# Patient Record
Sex: Female | Born: 1980
Health system: Southern US, Community
[De-identification: ages and names within clinical notes are randomized; demographics above are authoritative.]

---

## 2001-04-11 ENCOUNTER — Other Ambulatory Visit: Admission: RE | Admit: 2001-04-11 | Discharge: 2001-04-11 | Payer: Self-pay | Admitting: Obstetrics and Gynecology

## 2001-04-17 ENCOUNTER — Encounter: Payer: Self-pay | Admitting: Obstetrics and Gynecology

## 2001-04-17 ENCOUNTER — Ambulatory Visit (HOSPITAL_COMMUNITY): Admission: RE | Admit: 2001-04-17 | Discharge: 2001-04-17 | Payer: Self-pay | Admitting: Obstetrics and Gynecology

## 2002-04-11 ENCOUNTER — Inpatient Hospital Stay (HOSPITAL_COMMUNITY): Admission: RE | Admit: 2002-04-11 | Discharge: 2002-04-14 | Payer: Self-pay | Admitting: Obstetrics and Gynecology

## 2002-04-11 ENCOUNTER — Encounter: Payer: Self-pay | Admitting: Obstetrics and Gynecology

## 2003-04-04 ENCOUNTER — Inpatient Hospital Stay (HOSPITAL_COMMUNITY): Admission: RE | Admit: 2003-04-04 | Discharge: 2003-04-06 | Payer: Self-pay | Admitting: Obstetrics and Gynecology

## 2015-12-25 ENCOUNTER — Encounter: Payer: Self-pay | Admitting: Family Medicine

## 2015-12-25 ENCOUNTER — Ambulatory Visit (INDEPENDENT_AMBULATORY_CARE_PROVIDER_SITE_OTHER): Payer: 59 | Admitting: Family Medicine

## 2015-12-25 VITALS — BP 128/82 | Temp 98.4°F | Wt 246.8 lb

## 2015-12-25 DIAGNOSIS — J329 Chronic sinusitis, unspecified: Secondary | ICD-10-CM | POA: Diagnosis not present

## 2015-12-25 DIAGNOSIS — J209 Acute bronchitis, unspecified: Secondary | ICD-10-CM

## 2015-12-25 DIAGNOSIS — J310 Chronic rhinitis: Secondary | ICD-10-CM

## 2015-12-25 MED ORDER — AMOXICILLIN-POT CLAVULANATE 875-125 MG PO TABS: 1.0000 | ORAL_TABLET | Freq: Two times a day (BID) | ORAL | Status: AC

## 2015-12-25 NOTE — Progress Notes (Signed)
   Subjective:    Patient ID: Norm ParcelMattie M Delaguila, female    DOB: [birth year], 35 y.o.   MRN: [id number]  Cough This is a new problem. The current episode started in the past 7 days. Associated symptoms include a fever and nasal congestion. Treatments tried: mucinex, nyquil.    ches t cong and productive  Feels really bad an d rattly  Low gr temp 99.8  Took ibuprofen  Fever was initial  Stared withsinus  Dim energy   Clear disch at times, gunky and thick and yellow t times     Review of Systems  Constitutional: Positive for fever.  Respiratory: Positive for cough.        Objective:   Physical Exam  Alert, mild malaise. Hydration good Vitals stable. frontal/ maxillary tenderness evident positive nasal congestion. pharynx normal neck supple  lungs clear/no crackles or wheezes. heart regular in rhythm       Assessment & Plan:  Impression rhinosinusitis /bronchitislikely post viral, discussed with patient. plan antibiotics prescribed. Questions answered. Symptomatic care discussed. warning signs discussed. WSL

## 2016-01-15 ENCOUNTER — Ambulatory Visit (INDEPENDENT_AMBULATORY_CARE_PROVIDER_SITE_OTHER): Payer: 59 | Admitting: Nurse Practitioner

## 2016-01-15 ENCOUNTER — Encounter: Payer: Self-pay | Admitting: Nurse Practitioner

## 2016-01-15 VITALS — BP 128/80 | Ht 65.0 in | Wt 247.2 lb

## 2016-01-15 DIAGNOSIS — Z1151 Encounter for screening for human papillomavirus (HPV): Secondary | ICD-10-CM

## 2016-01-15 DIAGNOSIS — Z124 Encounter for screening for malignant neoplasm of cervix: Secondary | ICD-10-CM

## 2016-01-15 DIAGNOSIS — Z0000 Encounter for general adult medical examination without abnormal findings: Secondary | ICD-10-CM

## 2016-01-15 DIAGNOSIS — L680 Hirsutism: Secondary | ICD-10-CM

## 2016-01-15 DIAGNOSIS — Z01419 Encounter for gynecological examination (general) (routine) without abnormal findings: Secondary | ICD-10-CM

## 2016-01-15 MED ORDER — PHENTERMINE HCL 37.5 MG PO TABS: 37.5000 mg | ORAL_TABLET | Freq: Every day | ORAL | Status: DC

## 2016-01-15 NOTE — Progress Notes (Signed)
Subjective:    Patient ID: Raven Burch, female    DOB: [birth year], 35 y.o.   MRN: [id number]  HPI presents for her wellness physical. This is the first one she has had in a long time due to lack of insurance. Married, same sexual partner. Regular cycles, normal flow lasting up to 5 days. Has 2 children both weighing over 9 lbs at birth. It was noted that patient shaves chest, abd and chin due to significant hair growth. No acne. Weighed about 200 lbs around the birth of her first child. Hair started growing during her second pregnancy but has gotten worse. Her mother had 2 miscarriages, excessive hair growth and very obese. Regular vision exams. Plans dental care now that she has insurance. Very active lifestyle.     Review of Systems  Constitutional: Positive for fatigue. Negative for activity change and appetite change.  HENT: Negative for dental problem, ear pain, sinus pressure and sore throat.   Respiratory: Negative for cough, chest tightness, shortness of breath and wheezing.   Cardiovascular: Negative for chest pain.  Gastrointestinal: Negative for nausea, vomiting, abdominal pain, diarrhea, constipation and abdominal distention.  Genitourinary: Negative for dysuria, urgency, frequency, vaginal discharge, enuresis, difficulty urinating, genital sores, menstrual problem and pelvic pain.       Objective:   Physical Exam  Constitutional: She is oriented to person, place, and time. She appears well-developed. No distress.  HENT:  Right Ear: External ear normal.  Left Ear: External ear normal.  Mouth/Throat: Oropharynx is clear and moist.  Neck: Normal range of motion. Neck supple. No tracheal deviation present. No thyromegaly present.  Cardiovascular: Normal rate, regular rhythm and normal heart sounds.  Exam reveals no gallop.   No murmur heard. Pulmonary/Chest: Effort normal and breath sounds normal.  Abdominal: Soft. She exhibits no distension. There is no tenderness.    Genitourinary: Vagina normal and uterus normal. No vaginal discharge found.  External GU: no rashes or lesions. Vagina: minimal mucoid discharge. Cervix normal in appearance; no CMT. Bimanual exam: no tenderness or obvious masses. Exam limited due to abd girth and anxiety level regarding pelvic exam.   Musculoskeletal: She exhibits no edema.  Lymphadenopathy:    She has no cervical adenopathy.  Neurological: She is alert and oriented to person, place, and time.  Skin: Skin is warm and dry. No rash noted.  Psychiatric: She has a normal mood and affect. Her behavior is normal.  Vitals reviewed. Breast exam: Very dense tissue, no masses. Axilla no adenopathy. Patient has shaved her chest abdomen and chin, slight hair growth noted over most of the chest and abdomen.        Assessment & Plan:   Problem List Items Addressed This Visit      Musculoskeletal and Integument   Hirsutism   Relevant Orders   Insulin, fasting   Testosterone     Other   Morbid obesity (HCC)   Relevant Medications   phentermine (ADIPEX-P) 37.5 MG tablet   Other Relevant Orders   Lipid panel   Basic metabolic panel   TSH    Other Visit Diagnoses    Well woman exam    -  Primary    Relevant Orders    Pap IG and HPV (high risk) DNA detection    Lipid panel    Hepatic function panel    Basic metabolic panel    TSH    VITAMIN D 25 Hydroxy (Vit-D Deficiency, Fractures)    Screening for cervical cancer  Relevant Orders    Pap IG and HPV (high risk) DNA detection    Screening for HPV (human papillomavirus)        Relevant Orders    Pap IG and HPV (high risk) DNA detection      Discussed importance of healthy diet low in sugar and simple carbs and weight loss. Discussed possible diagnosis of PCOS. Reviewed options including hormones and Aldactone. Will start with phentermine for weight loss, reviewed potential adverse effects. DC med and call if any problems. Recommend tetanus exam at local  pharmacy. Return in about 1 month (around 02/14/2016) for recheck.

## 2016-01-16 ENCOUNTER — Other Ambulatory Visit: Payer: Self-pay | Admitting: Nurse Practitioner

## 2016-01-16 DIAGNOSIS — E282 Polycystic ovarian syndrome: Secondary | ICD-10-CM

## 2016-01-16 DIAGNOSIS — E559 Vitamin D deficiency, unspecified: Secondary | ICD-10-CM | POA: Insufficient documentation

## 2016-01-16 LAB — TSH: TSH: 2.77 u[IU]/mL (ref 0.450–4.500)

## 2016-01-16 LAB — VITAMIN D 25 HYDROXY (VIT D DEFICIENCY, FRACTURES): Vit D, 25-Hydroxy: 19 ng/mL — ABNORMAL LOW (ref 30.0–100.0)

## 2016-01-16 LAB — BASIC METABOLIC PANEL
BUN/Creatinine Ratio: 8 — ABNORMAL LOW (ref 9–23)
BUN: 7 mg/dL (ref 6–20)
CO2: 22 mmol/L (ref 18–29)
Calcium: 9.8 mg/dL (ref 8.7–10.2)
Chloride: 100 mmol/L (ref 96–106)
Creatinine, Ser: 0.88 mg/dL (ref 0.57–1.00)
GFR calc Af Amer: 98 mL/min/{1.73_m2} (ref >59)
GFR calc non Af Amer: 85 mL/min/{1.73_m2} (ref >59)
Glucose: 84 mg/dL (ref 65–99)
Potassium: 4.1 mmol/L (ref 3.5–5.2)
Sodium: 141 mmol/L (ref 134–144)

## 2016-01-16 LAB — INSULIN, RANDOM: INSULIN: 17 u[IU]/mL (ref 2.6–24.9)

## 2016-01-16 LAB — LIPID PANEL
Chol/HDL Ratio: 4.1 ratio units (ref 0.0–4.4)
Cholesterol, Total: 147 mg/dL (ref 100–199)
HDL: 36 mg/dL — ABNORMAL LOW (ref >39)
LDL Calculated: 85 mg/dL (ref 0–99)
Triglycerides: 130 mg/dL (ref 0–149)
VLDL Cholesterol Cal: 26 mg/dL (ref 5–40)

## 2016-01-16 LAB — HEPATIC FUNCTION PANEL
ALT: 33 IU/L — ABNORMAL HIGH (ref 0–32)
AST: 26 IU/L (ref 0–40)
Albumin: 4.6 g/dL (ref 3.5–5.5)
Alkaline Phosphatase: 100 IU/L (ref 39–117)
Bilirubin Total: 0.4 mg/dL (ref 0.0–1.2)
Bilirubin, Direct: 0.11 mg/dL (ref 0.00–0.40)
Total Protein: 7.3 g/dL (ref 6.0–8.5)

## 2016-01-16 LAB — TESTOSTERONE: Testosterone: 64 ng/dL — ABNORMAL HIGH (ref 8–48)

## 2016-01-16 MED ORDER — VITAMIN D (ERGOCALCIFEROL) 1.25 MG (50000 UNIT) PO CAPS: 50000.0000 [IU] | ORAL_CAPSULE | ORAL | Status: DC

## 2016-01-19 LAB — PAP IG AND HPV HIGH-RISK
HPV, high-risk: NEGATIVE
PAP Smear Comment: 0

## 2016-02-16 ENCOUNTER — Ambulatory Visit (INDEPENDENT_AMBULATORY_CARE_PROVIDER_SITE_OTHER): Payer: 59 | Admitting: Nurse Practitioner

## 2016-02-16 ENCOUNTER — Encounter: Payer: Self-pay | Admitting: Nurse Practitioner

## 2016-02-16 VITALS — BP 128/82 | Ht 65.0 in | Wt 236.6 lb

## 2016-02-16 DIAGNOSIS — E559 Vitamin D deficiency, unspecified: Secondary | ICD-10-CM | POA: Diagnosis not present

## 2016-02-16 MED ORDER — PHENTERMINE HCL 37.5 MG PO TABS: 37.5000 mg | ORAL_TABLET | Freq: Every day | ORAL | Status: DC

## 2016-02-16 MED ORDER — VITAMIN D (ERGOCALCIFEROL) 1.25 MG (50000 UNIT) PO CAPS: 50000.0000 [IU] | ORAL_CAPSULE | ORAL | Status: DC

## 2016-02-17 ENCOUNTER — Encounter: Payer: Self-pay | Admitting: Nurse Practitioner

## 2016-02-17 NOTE — Progress Notes (Signed)
Subjective:  Presents for routine follow-up. Doing well on phentermine. Denies any adverse effects. Has increased her activity. Doing much better with her diet. Also patient did not start her vitamin D prescription, states it was not at the pharmacy.  Objective:   BP 128/82 mmHg  Ht 5\' 5"  (1.651 m)  Wt 236 lb 9.6 oz (107.321 kg)  BMI 39.37 kg/m2 NAD. Alert, oriented. Lungs clear. Heart regular rate rhythm. Has lost approximately 11 pounds since her last visit.   Assessment:  Problem List Items Addressed This Visit      Other   Morbid obesity (HCC)   Relevant Medications   phentermine (ADIPEX-P) 37.5 MG tablet   Vitamin D deficiency - Primary     Plan:  Meds ordered this encounter  Medications  . Vitamin D, Ergocalciferol, (DRISDOL) 50000 units CAPS capsule    Sig: Take 1 capsule (50,000 Units total) by mouth every 7 (seven) days.    Dispense:  4 capsule    Refill:  2    Order Specific Question:  Supervising Provider    Answer:  Merlyn AlbertLUKING, WILLIAM S [[id number]]  . phentermine (ADIPEX-P) 37.5 MG tablet    Sig: Take 1 tablet (37.5 mg total) by mouth daily before breakfast.    Dispense:  30 tablet    Refill:  2    Order Specific Question:  Supervising Provider    Answer:  Merlyn AlbertLUKING, WILLIAM S [[id number]]   Continue phentermine as directed. DC med if weight begins to plateau. Also start vitamin D prescription as directed. Recheck in 3 months if she wishes to continue phentermine.

## 2016-05-17 ENCOUNTER — Ambulatory Visit: Payer: 59 | Admitting: Nurse Practitioner

## 2016-05-27 ENCOUNTER — Encounter: Payer: Self-pay | Admitting: Family Medicine

## 2016-12-23 ENCOUNTER — Ambulatory Visit (INDEPENDENT_AMBULATORY_CARE_PROVIDER_SITE_OTHER): Payer: PRIVATE HEALTH INSURANCE | Admitting: Nurse Practitioner

## 2016-12-23 ENCOUNTER — Encounter: Payer: Self-pay | Admitting: Nurse Practitioner

## 2016-12-23 VITALS — BP 126/82 | Temp 98.2°F | Ht 65.0 in | Wt 241.8 lb

## 2016-12-23 DIAGNOSIS — J0110 Acute frontal sinusitis, unspecified: Secondary | ICD-10-CM | POA: Diagnosis not present

## 2016-12-23 MED ORDER — HYDROCODONE-HOMATROPINE 5-1.5 MG/5ML PO SYRP: 5.0000 mL | ORAL_SOLUTION | ORAL | 0 refills | Status: DC | PRN

## 2016-12-23 MED ORDER — AMOXICILLIN-POT CLAVULANATE 875-125 MG PO TABS: 1.0000 | ORAL_TABLET | Freq: Two times a day (BID) | ORAL | 0 refills | Status: DC

## 2016-12-24 ENCOUNTER — Encounter: Payer: Self-pay | Admitting: Nurse Practitioner

## 2016-12-24 NOTE — Progress Notes (Signed)
Subjective:  Presents for c/o cough and runny nose that began 3 days ago. Low grade fever. Frontal area headache. Clear nasal drainage. Frequent non productive cough mainly at night and when she gets hot. No sore throat or wheezing. Ear pressure.   Objective:   BP 126/82   Temp 98.2 F (36.8 C) (Oral)   Ht  (1.651 m)   Wt 241 lb 12.8 oz (109.7 kg)   BMI 40.24 kg/m  NAD. Alert, oriented. TMs retracted, no erythema. Pharynx injected with green PND noted. Neck supple with mild anterior adenopathy. Lungs clear. Heart RRR.   Assessment:  Acute non-recurrent frontal sinusitis    Plan:   Meds ordered this encounter  Medications  . amoxicillin-clavulanate (AUGMENTIN) 875-125 MG tablet    Sig: Take 1 tablet by mouth 2 (two) times daily.    Dispense:  20 tablet    Refill:  0    Order Specific Question:   Supervising Provider    Answer:   Merlyn Albert [[id number]]  . HYDROcodone-homatropine (HYCODAN) 5-1.5 MG/5ML syrup    Sig: Take 5 mLs by mouth every 4 (four) hours as needed.    Dispense:  90 mL    Refill:  0    Order Specific Question:   Supervising Provider    Answer:   Merlyn Albert [[id number]]   OTC meds as directed. Warning signs reviewed. Call back if worsens or persists.

## 2017-08-03 ENCOUNTER — Encounter: Payer: Self-pay | Admitting: Nurse Practitioner

## 2017-08-03 ENCOUNTER — Ambulatory Visit (INDEPENDENT_AMBULATORY_CARE_PROVIDER_SITE_OTHER): Payer: PRIVATE HEALTH INSURANCE | Admitting: Nurse Practitioner

## 2017-08-03 VITALS — BP 128/84 | Ht 65.0 in | Wt 240.4 lb

## 2017-08-03 DIAGNOSIS — Z01419 Encounter for gynecological examination (general) (routine) without abnormal findings: Secondary | ICD-10-CM | POA: Diagnosis not present

## 2017-08-08 ENCOUNTER — Encounter: Payer: Self-pay | Admitting: Nurse Practitioner

## 2017-08-08 NOTE — Progress Notes (Signed)
   Subjective:    Patient ID: Raven Burch, female    DOB: December 12, 1980, [age] y.o.   MRN: [id number]  HPI presents for her wellness exam.  Overall healthy diet.  Has had tubal ligation for birth control.  Same sexual partner.  Regular menses normal flow.  No regular exercise but does have an active job.  Regular vision and dental care.    Review of Systems  Constitutional: Negative for activity change, appetite change and fatigue.  HENT: Negative for dental problem, ear pain, sinus pressure and sore throat.   Respiratory: Negative for cough, chest tightness, shortness of breath and wheezing.   Cardiovascular: Negative for chest pain.  Gastrointestinal: Negative for abdominal distention, abdominal pain, blood in stool, constipation, diarrhea, nausea and vomiting.  Genitourinary: Negative for difficulty urinating, dysuria, enuresis, frequency, genital sores, menstrual problem, pelvic pain, urgency and vaginal discharge.       Objective:   Physical Exam  Constitutional: She is oriented to person, place, and time. She appears well-developed. No distress.  HENT:  Right Ear: External ear normal.  Left Ear: External ear normal.  Mouth/Throat: Oropharynx is clear and moist.  Neck: Normal range of motion. Neck supple. No tracheal deviation present. No thyromegaly present.  Cardiovascular: Normal rate, regular rhythm and normal heart sounds. Exam reveals no gallop.  No murmur heard. Pulmonary/Chest: Effort normal and breath sounds normal. Right breast exhibits no inverted nipple, no mass, no skin change and no tenderness. Left breast exhibits no inverted nipple, no mass, no skin change and no tenderness. Breasts are symmetrical.  Axilla no adenopathy.  Abdominal: Soft. She exhibits no distension. There is no tenderness.  Genitourinary: Vagina normal and uterus normal. No vaginal discharge found.  Genitourinary Comments: External GU no rashes or lesions.  Vagina no discharge.  Cervix normal limit  in appearance.  No CMT.  Bimanual exam no masses or tenderness noted, exam limited due to abdominal girth.  Musculoskeletal: She exhibits no edema.  Lymphadenopathy:    She has no cervical adenopathy.  Neurological: She is alert and oriented to person, place, and time.  Skin: Skin is warm and dry. No rash noted.  Psychiatric: She has a normal mood and affect. Her behavior is normal.  Vitals reviewed.         Assessment & Plan:  Well woman exam  Encouraged regular activity healthy diet and weight loss.  Also continue vitamin D supplementation.  Recommend yearly lab work. Return in about 1 year (around 08/03/2018) for physical.

## 2017-09-13 ENCOUNTER — Emergency Department (HOSPITAL_COMMUNITY)
Admission: EM | Admit: 2017-09-13 | Discharge: 2017-09-13 | Disposition: A | Discharge status: Home / Self Care | Payer: No Typology Code available for payment source | Attending: Emergency Medicine | Admitting: Emergency Medicine

## 2017-09-13 ENCOUNTER — Emergency Department (HOSPITAL_COMMUNITY): Payer: No Typology Code available for payment source

## 2017-09-13 ENCOUNTER — Other Ambulatory Visit: Payer: Self-pay

## 2017-09-13 ENCOUNTER — Encounter (HOSPITAL_COMMUNITY): Payer: Self-pay | Admitting: Emergency Medicine

## 2017-09-13 DIAGNOSIS — N200 Calculus of kidney: Secondary | ICD-10-CM | POA: Insufficient documentation

## 2017-09-13 DIAGNOSIS — R109 Unspecified abdominal pain: Secondary | ICD-10-CM | POA: Diagnosis present

## 2017-09-13 LAB — URINALYSIS, ROUTINE W REFLEX MICROSCOPIC
GLUCOSE, UA: NEGATIVE mg/dL
Hgb urine dipstick: NEGATIVE
Ketones, ur: NEGATIVE mg/dL
Leukocytes, UA: NEGATIVE
NITRITE: NEGATIVE
PROTEIN: 30 mg/dL — AB
Specific Gravity, Urine: 1.036 — ABNORMAL HIGH (ref 1.005–1.030)
pH: 5 (ref 5.0–8.0)

## 2017-09-13 LAB — I-STAT BETA HCG BLOOD, ED (MC, WL, AP ONLY): I-stat hCG, quantitative: <5 m[IU]/mL (ref <5)

## 2017-09-13 MED ORDER — MORPHINE SULFATE (PF) 4 MG/ML IV SOLN: 4.0000 mg | Freq: Once | INTRAVENOUS | Status: DC

## 2017-09-13 MED ORDER — ONDANSETRON HCL 4 MG/2ML IJ SOLN
4.0000 mg | Freq: Once | INTRAMUSCULAR | Status: AC
  Administered 2017-09-13: 4 mg via INTRAVENOUS
  Filled 2017-09-13: qty 2

## 2017-09-13 MED ORDER — MORPHINE SULFATE (PF) 10 MG/ML IV SOLN
INTRAVENOUS | Status: AC
  Administered 2017-09-13: 4 mg
  Filled 2017-09-13: qty 1

## 2017-09-13 MED ORDER — KETOROLAC TROMETHAMINE 30 MG/ML IJ SOLN
30.0000 mg | Freq: Once | INTRAMUSCULAR | Status: AC
  Administered 2017-09-13: 30 mg via INTRAVENOUS
  Filled 2017-09-13: qty 1

## 2017-09-13 MED ORDER — OXYCODONE-ACETAMINOPHEN 5-325 MG PO TABS: 1.0000 | ORAL_TABLET | Freq: Four times a day (QID) | ORAL | 0 refills | Status: DC | PRN

## 2017-09-13 NOTE — ED Provider Notes (Signed)
[facility] Provider Note   CSN: [id number] Arrival date & time: 09/13/17  0309     History   Chief Complaint Chief Complaint  Patient presents with  . Back Pain    HPI Raven Burch is a 36 y.o. female.  Patient is a 36 year old female with history of polycystic ovaries presenting for evaluation of left flank pain.  This woke her from sleep approximately 4 hours ago.  Her pain radiates into the left lower quadrant.  She denies any injury or trauma.  She does report the urge to urinate but denies burning.  She denies any blood in her urine.  She denies any fevers or chills.  She denies any radiation into the legs, numbness, or tingling.   The history is provided by the patient.  Flank Pain  This is a new problem. Episode onset: 11 PM. The problem occurs constantly. The problem has been rapidly worsening. Associated symptoms include abdominal pain. Nothing aggravates the symptoms. Nothing relieves the symptoms. She has tried nothing for the symptoms.    History reviewed. No pertinent past medical history.  Patient Active Problem List   Diagnosis Date Noted  . PCOS (polycystic ovarian syndrome) 01/16/2016  . Vitamin D deficiency 01/16/2016  . Hirsutism 01/15/2016  . Morbid obesity (HCC) 01/15/2016    History reviewed. No pertinent surgical history.  OB History    No data available       Home Medications    Prior to Admission medications   Not on File    Family History Family History  Problem Relation Age of Onset  . Depression Sister   . Heart disease Maternal Grandmother [id number]       died of MI  . Cancer Maternal Grandfather        prostate  . Diabetes Maternal Grandfather     Social History Social History   Tobacco Use  . Smoking status: Never Smoker  . Smokeless tobacco: Never Used  Substance Use Topics  . Alcohol use: No    Alcohol/week: 0.0 oz  . Drug use: No     Allergies   Patient has no known allergies.   Review  of Systems Review of Systems  Gastrointestinal: Positive for abdominal pain.  Genitourinary: Positive for flank pain.  All other systems reviewed and are negative.    Physical Exam Updated Vital Signs BP (!) 156/98   Pulse 89   Temp 97.8 F (36.6 C) (Oral)   Resp 18   LMP 08/18/2017   SpO2 100%   Physical Exam  Constitutional: She is oriented to person, place, and time. She appears well-developed and well-nourished. No distress.  HENT:  Head: Normocephalic and atraumatic.  Neck: Normal range of motion. Neck supple.  Cardiovascular: Normal rate and regular rhythm. Exam reveals no gallop and no friction rub.  No murmur heard. Pulmonary/Chest: Effort normal and breath sounds normal. No respiratory distress. She has no wheezes.  Abdominal: Soft. Bowel sounds are normal. She exhibits no distension. There is no tenderness.  There is mild left-sided CVA tenderness.  Musculoskeletal: Normal range of motion.  Neurological: She is alert and oriented to person, place, and time.  Skin: Skin is warm and dry. She is not diaphoretic.  Nursing note and vitals reviewed.    ED Treatments / Results  Labs (all labs ordered are listed, but only abnormal results are displayed) Labs Reviewed  URINALYSIS, ROUTINE W REFLEX MICROSCOPIC  PREGNANCY, URINE    EKG  EKG Interpretation None  Radiology No results found.  Procedures Procedures (including critical care time)  Medications Ordered in ED Medications  morphine 4 MG/ML injection 4 mg (not administered)  ketorolac (TORADOL) 30 MG/ML injection 30 mg (not administered)  ondansetron (ZOFRAN) injection 4 mg (not administered)     Initial Impression / Assessment and Plan / ED Course  I have reviewed the triage vital signs and the nursing notes.  Pertinent labs & imaging results that were available during my care of the patient were reviewed by me and considered in my medical decision making (see chart for details).  CT  scan shows a punctate renal calculus at the left UVJ.  She is feeling better after medications given in the ER.  She will be discharged with pain medicine and is to follow-up with urology if the stone has not passed in the next 3 days.  Final Clinical Impressions(s) / ED Diagnoses   Final diagnoses:  None    ED Discharge Orders    None       Geoffery Lyonselo, Gilad Dugger, MD 09/13/17 [phone]

## 2017-09-13 NOTE — Discharge Instructions (Signed)
Percocet as prescribed as needed for pain.  Follow-up with urology if your symptoms are not improving in the next 3 days.  The contact information for the urology clinic has been provided in this discharge summary for you to call and make these arrangements.  Return to the emergency department if you develop worsening pain, high fevers, or other new and concerning symptoms.

## 2017-09-13 NOTE — ED Triage Notes (Signed)
Pt states she started having back pain that woke her up from sleep around 11 pm.

## 2018-03-14 ENCOUNTER — Encounter: Payer: Self-pay | Admitting: Family Medicine

## 2018-03-14 ENCOUNTER — Ambulatory Visit (INDEPENDENT_AMBULATORY_CARE_PROVIDER_SITE_OTHER): Payer: 59 | Admitting: Family Medicine

## 2018-03-14 VITALS — BP 126/90 | Temp 98.5°F | Ht 65.0 in | Wt 242.0 lb

## 2018-03-14 DIAGNOSIS — J019 Acute sinusitis, unspecified: Secondary | ICD-10-CM

## 2018-03-14 MED ORDER — AMOXICILLIN-POT CLAVULANATE 875-125 MG PO TABS: 1.0000 | ORAL_TABLET | Freq: Two times a day (BID) | ORAL | 0 refills | Status: DC

## 2018-03-14 NOTE — Progress Notes (Signed)
   Subjective:    Patient ID: Raven ParcelMattie M Oliphant, female    DOB: 1980/11/27, 37 y.o.   MRN: [id number]  Sinusitis  This is a new problem. Episode onset: one week. Associated symptoms include congestion, coughing and ear pain. Pertinent negatives include no shortness of breath. Treatments tried: allergy meds, tussin dm. The treatment provided no relief.   Significant head congestion drainage coughing denies wheezing difficulty breathing denies sweats chills   Review of Systems  Constitutional: Negative for activity change and fever.  HENT: Positive for congestion, ear pain and rhinorrhea.   Eyes: Negative for discharge.  Respiratory: Positive for cough. Negative for shortness of breath and wheezing.   Cardiovascular: Negative for chest pain.       Objective:   Physical Exam  Constitutional: She appears well-developed.  HENT:  Head: Normocephalic.  Nose: Nose normal.  Mouth/Throat: Oropharynx is clear and moist. No oropharyngeal exudate.  Neck: Neck supple.  Cardiovascular: Normal rate and normal heart sounds.  No murmur heard. Pulmonary/Chest: Effort normal and breath sounds normal. She has no wheezes.  Lymphadenopathy:    She has no cervical adenopathy.  Skin: Skin is warm and dry.  Nursing note and vitals reviewed.         Assessment & Plan:  Patient was seen today for upper respiratory illness. It is felt that the patient is dealing with sinusitis.  Antibiotics were prescribed today. Importance of compliance with medication was discussed.  Symptoms should gradually resolve over the course of the next several days. If high fevers, progressive illness, difficulty breathing, worsening condition or failure for symptoms to improve over the next several days then the patient is to follow-up.  If any emergent conditions the patient is to follow-up in the emergency department otherwise to follow-up in the office.

## 2018-06-12 ENCOUNTER — Ambulatory Visit (HOSPITAL_COMMUNITY)
Admission: RE | Admit: 2018-06-12 | Discharge: 2018-06-12 | Disposition: A | Discharge status: Home / Self Care | Payer: 59 | Source: Ambulatory Visit | Attending: Family Medicine | Admitting: Family Medicine

## 2018-06-12 ENCOUNTER — Ambulatory Visit (INDEPENDENT_AMBULATORY_CARE_PROVIDER_SITE_OTHER): Payer: 59 | Admitting: Family Medicine

## 2018-06-12 ENCOUNTER — Encounter: Payer: Self-pay | Admitting: Family Medicine

## 2018-06-12 VITALS — BP 138/90 | Temp 98.6°F | Ht 65.0 in | Wt 252.0 lb

## 2018-06-12 DIAGNOSIS — X58XXXA Exposure to other specified factors, initial encounter: Secondary | ICD-10-CM | POA: Insufficient documentation

## 2018-06-12 DIAGNOSIS — M79672 Pain in left foot: Secondary | ICD-10-CM

## 2018-06-12 DIAGNOSIS — S93602A Unspecified sprain of left foot, initial encounter: Secondary | ICD-10-CM | POA: Diagnosis not present

## 2018-06-12 NOTE — Progress Notes (Signed)
   Subjective:    Patient ID: Raven Burch, female    DOB: [birth year], 37 y.o.   MRN: [id number]  HPI Patient is here today with painful left foot.She states she stepped in a hole in the ground two weeks ago and injured it. Now it is painful.She has been icing it and using Armeniachina gel. Patient relates that she stepped on uneven spot her foot twisted inward she has pain and discomfort on the lateral aspect she denies any ankle pain denies calf pain or knee pain.  Review of Systems  Constitutional: Negative for activity change, fatigue and fever.  HENT: Negative for congestion and rhinorrhea.   Respiratory: Negative for cough, chest tightness and shortness of breath.   Cardiovascular: Negative for chest pain and leg swelling.  Gastrointestinal: Negative for abdominal pain and nausea.  Skin: Negative for color change.  Neurological: Negative for dizziness and headaches.  Psychiatric/Behavioral: Negative for agitation and behavioral problems.       Objective:   Patient with tenderness pain and discomfort in the left lateral foot ankle normal calf normal knee normal right leg normal        Assessment & Plan:  Foot pain Probable sprain Possible stress fracture X-ray ordered May need walking boot Follow-up if progressive troubles

## 2018-06-13 ENCOUNTER — Telehealth: Payer: Self-pay | Admitting: Family Medicine

## 2018-06-13 NOTE — Telephone Encounter (Signed)
Prescription was signed The patient should use this walking boot for the next approximately 2-1/2 to 3 weeks At that point should be doing better. If not doing better then I recommend follow-up or calling us and we may need to help set her up with orthopedics

## 2018-06-13 NOTE — Telephone Encounter (Signed)
Patient is aware.Dr.Scott to sign rx and [facility] will need [facility].

## 2018-06-13 NOTE — Telephone Encounter (Signed)
Patient would like results of x-ray 

## 2018-06-13 NOTE — Telephone Encounter (Signed)
The x-ray looks good. The patient would benefit from a walking boot to use over the course of the next 2 to 3 weeks If she is interested [facility]

## 2018-06-14 ENCOUNTER — Telehealth: Payer: Self-pay | Admitting: Family Medicine

## 2018-06-14 NOTE — Telephone Encounter (Signed)
See other message : Patient notified and advised per Dr Lorin PicketScott: The patient should use this walking boot for the next approximately 2-1/2to 3 weeks. At that point should be doing better. If not doing better then Dr Lorin PicketScott recommends follow-up or calling us and we may need to help set her up with orthopedics. Patient verbalized understanding

## 2018-06-14 NOTE — Telephone Encounter (Signed)
Returning call to Nurse.She can be searched at [phone]

## 2018-06-14 NOTE — Telephone Encounter (Signed)
Left message to return call 

## 2018-06-14 NOTE — Telephone Encounter (Signed)
Patient notified and advised per Dr Lorin PicketScott: The patient should use this walking boot for the next approximately 2-1/2 to 3 weeks. At that point should be doing better. If not doing better then Dr Lorin PicketScott recommends follow-up or calling us and we may need to help set her up with orthopedics. Patient verbalized understanding.

## 2018-08-17 ENCOUNTER — Ambulatory Visit (INDEPENDENT_AMBULATORY_CARE_PROVIDER_SITE_OTHER): Payer: 59 | Admitting: Family Medicine

## 2018-08-17 ENCOUNTER — Encounter: Payer: Self-pay | Admitting: Family Medicine

## 2018-08-17 VITALS — BP 136/94 | Ht 65.0 in | Wt 251.0 lb

## 2018-08-17 DIAGNOSIS — Z23 Encounter for immunization: Secondary | ICD-10-CM

## 2018-08-17 DIAGNOSIS — Z0000 Encounter for general adult medical examination without abnormal findings: Secondary | ICD-10-CM | POA: Diagnosis not present

## 2018-08-17 DIAGNOSIS — E785 Hyperlipidemia, unspecified: Secondary | ICD-10-CM

## 2018-08-17 DIAGNOSIS — E559 Vitamin D deficiency, unspecified: Secondary | ICD-10-CM

## 2018-08-17 DIAGNOSIS — R748 Abnormal levels of other serum enzymes: Secondary | ICD-10-CM

## 2018-08-17 DIAGNOSIS — R030 Elevated blood-pressure reading, without diagnosis of hypertension: Secondary | ICD-10-CM | POA: Diagnosis not present

## 2018-08-17 DIAGNOSIS — Z131 Encounter for screening for diabetes mellitus: Secondary | ICD-10-CM

## 2018-08-17 NOTE — Progress Notes (Signed)
Subjective:    Patient ID: Raven Burch, female    DOB: 1981/06/03, 37 y.o.   MRN: [id number]  HPI The patient comes in today for a wellness visit.  A review of their health history was completed. A review of medications was also completed.  Any needed refills; None  Eating habits: Good   Falls/  MVA accidents in past few months: None  Regular exercise: started at [employer] yesterday  Specialist pt sees on regular basis: No  Preventative health issues were discussed. Flu vaccine today.  Thinks tetanus is UTD. Regular vision and dental exams.   Additional concerns: None  LMP 08/13/18, regular cycles, no concerns, sexually active, 1 partner, declines STD screening.   Review of Systems  Constitutional: Negative for chills, fatigue, fever and unexpected weight change.  HENT: Negative for congestion, ear pain, sinus pressure, sinus pain and sore throat.   Eyes: Negative for discharge and visual disturbance.  Respiratory: Negative for cough, shortness of breath and wheezing.   Cardiovascular: Negative for chest pain and leg swelling.  Gastrointestinal: Negative for abdominal pain, blood in stool, constipation, diarrhea, nausea and vomiting.  Genitourinary: Negative for difficulty urinating, hematuria, menstrual problem, pelvic pain and vaginal discharge.  Neurological: Negative for dizziness, weakness, light-headedness and headaches.  Psychiatric/Behavioral: Negative for suicidal ideas.  All other systems reviewed and are negative.      Objective:   Physical Exam  Constitutional: She is oriented to person, place, and time. She appears well-developed and well-nourished. No distress.  HENT:  Head: Normocephalic and atraumatic.  Mouth/Throat: Oropharynx is clear and moist.  Eyes: Pupils are equal, round, and reactive to light. Conjunctivae and EOM are normal. Right eye exhibits no discharge. Left eye exhibits no discharge.  Neck: Neck supple. No thyromegaly present.    Cardiovascular: Normal rate, regular rhythm and normal heart sounds.  No murmur heard. Pulmonary/Chest: Effort normal and breath sounds normal. No respiratory distress. She has no wheezes. Right breast exhibits no inverted nipple, no mass, no nipple discharge, no skin change and no tenderness. Left breast exhibits no inverted nipple, no mass, no nipple discharge, no skin change and no tenderness.  Abdominal: Soft. Bowel sounds are normal. She exhibits no distension and no mass. There is no tenderness.  Genitourinary: Vagina normal and uterus normal. There is no rash, tenderness, lesion or injury on the right labia. There is no rash, tenderness, lesion or injury on the left labia. Cervix exhibits no motion tenderness and no discharge. Right adnexum displays no mass and no tenderness. Left adnexum displays no mass and no tenderness.  Genitourinary Comments: Chaperone present  Musculoskeletal: She exhibits no edema or deformity.  Lymphadenopathy:    She has no cervical adenopathy.  Neurological: She is alert and oriented to person, place, and time. Coordination normal.  Skin: Skin is warm and dry.  Psychiatric: She has a normal mood and affect.  Nursing note and vitals reviewed.      Assessment & Plan:  1. Annual physical exam Adult wellness-complete.wellness physical was conducted today. Importance of diet and exercise were discussed in detail.  In addition to this a discussion regarding safety was also covered. We also reviewed over immunizations and gave recommendations regarding current immunization needed for age.   -flu given today In addition to this additional areas were also touched on including: Preventative health exams needed:  Colonoscopy N/A Mammogram N/A Pap Smear UTD: normal and negative HPV in 2017, next due in 2022  Patient was advised yearly wellness  exam. Recommended screening labs, pt will f/u with her insurance to see what is covered and let us know.   2. Elevated  blood pressure reading in office without diagnosis of hypertension BP slightly elevated in office today. Recommended DASH diet, encouraged regular exercise. Will f/u in 3-4 months. If still elevated at that time will need to consider starting medication.  3. Need for vaccination - Plan: Flu Vaccine QUAD 36+ mos IM  4. Dyslipidemia - Plan: Lipid panel  5. Elevated liver enzymes - Plan: Hepatic function panel Slightly elevated ALT at 33 in 2017, recommend repeat lab to check for normalization.  6 Encounter for screening examination for impaired glucose regulation and diabetes mellitus - Plan: Glucose, random Recommend screening for diabetes given age and weight.   7. Vitamin D deficiency - Plan: VITAMIN D 25 Hydroxy (Vit-D Deficiency, Fractures) Hx of vitamin D deficiency, patient taking OTC supplement, requesting recheck.  Dr. Lilyan PuntScott Luking was consulted on this case and is in agreement with the above treatment plan.

## 2018-08-17 NOTE — Patient Instructions (Signed)
DASH Eating Plan DASH stands for "Dietary Approaches to Stop Hypertension." The DASH eating plan is a healthy eating plan that has been shown to reduce high blood pressure (hypertension). It may also reduce your risk for type 2 diabetes, heart disease, and stroke. The DASH eating plan may also help with weight loss. What are tips for following this plan? General guidelines  Avoid eating more than 2,300 mg (milligrams) of salt (sodium) a day. If you have hypertension, you may need to reduce your sodium intake to 1,500 mg a day.  Limit alcohol intake to no more than 1 drink a day for nonpregnant women and 2 drinks a day for men. One drink equals 12 oz of beer, 5 oz of wine, or 1 oz of hard liquor.  Work with your health care provider to maintain a healthy body weight or to lose weight. Ask what an ideal weight is for you.  Get at least 30 minutes of exercise that causes your heart to beat faster (aerobic exercise) most days of the week. Activities may include walking, swimming, or biking.  Work with your health care provider or diet and nutrition specialist (dietitian) to adjust your eating plan to your individual calorie needs. Reading food labels  Check food labels for the amount of sodium per serving. Choose foods with less than 5 percent of the Daily Value of sodium. Generally, foods with less than 300 mg of sodium per serving fit into this eating plan.  To find whole grains, look for the word "whole" as the first word in the ingredient list. Shopping  Buy products labeled as "low-sodium" or "no salt added."  Buy fresh foods. Avoid canned foods and premade or frozen meals. Cooking  Avoid adding salt when cooking. Use salt-free seasonings or herbs instead of table salt or sea salt. Check with your health care provider or pharmacist before using salt substitutes.  Do not fry foods. Cook foods using healthy methods such as baking, boiling, grilling, and broiling instead.  Cook with  heart-healthy oils, such as olive, canola, soybean, or sunflower oil. Meal planning   Eat a balanced diet that includes: ? 5 or more servings of fruits and vegetables each day. At each meal, try to fill half of your plate with fruits and vegetables. ? Up to 6-8 servings of whole grains each day. ? Less than 6 oz of lean meat, poultry, or fish each day. A 3-oz serving of meat is about the same size as a deck of cards. One egg equals 1 oz. ? 2 servings of low-fat dairy each day. ? A serving of nuts, seeds, or beans 5 times each week. ? Heart-healthy fats. Healthy fats called Omega-3 fatty acids are found in foods such as flaxseeds and coldwater fish, like sardines, salmon, and mackerel.  Limit how much you eat of the following: ? Canned or prepackaged foods. ? Food that is high in trans fat, such as fried foods. ? Food that is high in saturated fat, such as fatty meat. ? Sweets, desserts, sugary drinks, and other foods with added sugar. ? Full-fat dairy products.  Do not salt foods before eating.  Try to eat at least 2 vegetarian meals each week.  Eat more home-cooked food and less restaurant, buffet, and fast food.  When eating at a restaurant, ask that your food be prepared with less salt or no salt, if possible. What foods are recommended? The items listed may not be a complete list. Talk with your dietitian about what   dietary choices are best for you. Grains Whole-grain or whole-wheat bread. Whole-grain or whole-wheat pasta. Brown rice. Oatmeal. Quinoa. Bulgur. Whole-grain and low-sodium cereals. Pita bread. Low-fat, low-sodium crackers. Whole-wheat flour tortillas. Vegetables Fresh or frozen vegetables (raw, steamed, roasted, or grilled). Low-sodium or reduced-sodium tomato and vegetable juice. Low-sodium or reduced-sodium tomato sauce and tomato paste. Low-sodium or reduced-sodium canned vegetables. Fruits All fresh, dried, or frozen fruit. Canned fruit in natural juice (without  added sugar). Meat and other protein foods Skinless chicken or turkey. Ground chicken or turkey. Pork with fat trimmed off. Fish and seafood. Egg whites. Dried beans, peas, or lentils. Unsalted nuts, nut butters, and seeds. Unsalted canned beans. Lean cuts of beef with fat trimmed off. Low-sodium, lean deli meat. Dairy Low-fat (1%) or fat-free (skim) milk. Fat-free, low-fat, or reduced-fat cheeses. Nonfat, low-sodium ricotta or cottage cheese. Low-fat or nonfat yogurt. Low-fat, low-sodium cheese. Fats and oils Soft margarine without trans fats. Vegetable oil. Low-fat, reduced-fat, or light mayonnaise and salad dressings (reduced-sodium). Canola, safflower, olive, soybean, and sunflower oils. Avocado. Seasoning and other foods Herbs. Spices. Seasoning mixes without salt. Unsalted popcorn and pretzels. Fat-free sweets. What foods are not recommended? The items listed may not be a complete list. Talk with your dietitian about what dietary choices are best for you. Grains Baked goods made with fat, such as croissants, muffins, or some breads. Dry pasta or rice meal packs. Vegetables Creamed or fried vegetables. Vegetables in a cheese sauce. Regular canned vegetables (not low-sodium or reduced-sodium). Regular canned tomato sauce and paste (not low-sodium or reduced-sodium). Regular tomato and vegetable juice (not low-sodium or reduced-sodium). Pickles. Olives. Fruits Canned fruit in a light or heavy syrup. Fried fruit. Fruit in cream or butter sauce. Meat and other protein foods Fatty cuts of meat. Ribs. Fried meat. Bacon. Sausage. Bologna and other processed lunch meats. Salami. Fatback. Hotdogs. Bratwurst. Salted nuts and seeds. Canned beans with added salt. Canned or smoked fish. Whole eggs or egg yolks. Chicken or turkey with skin. Dairy Whole or 2% milk, cream, and half-and-half. Whole or full-fat cream cheese. Whole-fat or sweetened yogurt. Full-fat cheese. Nondairy creamers. Whipped toppings.  Processed cheese and cheese spreads. Fats and oils Butter. Stick margarine. Lard. Shortening. Ghee. Bacon fat. Tropical oils, such as coconut, palm kernel, or palm oil. Seasoning and other foods Salted popcorn and pretzels. Onion salt, garlic salt, seasoned salt, table salt, and sea salt. Worcestershire sauce. Tartar sauce. Barbecue sauce. Teriyaki sauce. Soy sauce, including reduced-sodium. Steak sauce. Canned and packaged gravies. Fish sauce. Oyster sauce. Cocktail sauce. Horseradish that you find on the shelf. Ketchup. Mustard. Meat flavorings and tenderizers. Bouillon cubes. Hot sauce and Tabasco sauce. Premade or packaged marinades. Premade or packaged taco seasonings. Relishes. Regular salad dressings. Where to find more information:  National Heart, Lung, and Blood Institute: www.nhlbi.nih.gov  American Heart Association: www.heart.org Summary  The DASH eating plan is a healthy eating plan that has been shown to reduce high blood pressure (hypertension). It may also reduce your risk for type 2 diabetes, heart disease, and stroke.  With the DASH eating plan, you should limit salt (sodium) intake to 2,300 mg a day. If you have hypertension, you may need to reduce your sodium intake to 1,500 mg a day.  When on the DASH eating plan, aim to eat more fresh fruits and vegetables, whole grains, lean proteins, low-fat dairy, and heart-healthy fats.  Work with your health care provider or diet and nutrition specialist (dietitian) to adjust your eating plan to your individual   calorie needs. This information is not intended to replace advice given to you by your health care provider. Make sure you discuss any questions you have with your health care provider. Document Released: 09/02/2011 Document Revised: 09/06/2016 Document Reviewed: 09/06/2016 Elsevier Interactive Patient Education  2018 [employer]  

## 2018-10-02 ENCOUNTER — Encounter: Payer: Self-pay | Admitting: Family Medicine

## 2018-10-02 ENCOUNTER — Ambulatory Visit (INDEPENDENT_AMBULATORY_CARE_PROVIDER_SITE_OTHER): Payer: 59 | Admitting: Family Medicine

## 2018-10-02 VITALS — BP 120/92 | Temp 98.6°F | Ht 65.0 in | Wt 248.0 lb

## 2018-10-02 DIAGNOSIS — J019 Acute sinusitis, unspecified: Secondary | ICD-10-CM

## 2018-10-02 MED ORDER — AMOXICILLIN-POT CLAVULANATE 875-125 MG PO TABS: 1.0000 | ORAL_TABLET | Freq: Two times a day (BID) | ORAL | 0 refills | Status: DC

## 2018-10-02 NOTE — Progress Notes (Signed)
   Subjective:    Patient ID: Raven Burch, female    DOB: 05/28/1981, 38 y.o.   MRN: [id number]  HPI Patient is here today with complaints of a cough,chest congestion,right ear pain,runny nose,wheezing,sore throat, ongoing since December 28,2019. She has been taking Day Quil,nite Quil,vicks vapor rub, and home remedy of honey,cayenne pepper,ginger,applcider vinegar.  Her symptoms been going on for over a week with head congestion drainage coughing chest congestion right ear pain discomfort sinus pressure not feeling good denies high fever chills wheezing difficulty breathing Review of Systems  Constitutional: Negative for activity change and fever.  HENT: Positive for congestion and rhinorrhea. Negative for ear pain.   Eyes: Negative for discharge.  Respiratory: Positive for cough. Negative for shortness of breath and wheezing.   Cardiovascular: Negative for chest pain.       Objective:   Physical Exam Vitals signs and nursing note reviewed.  Constitutional:      Appearance: She is well-developed.  HENT:     Head: Normocephalic.     Nose: Nose normal.     Mouth/Throat:     Pharynx: No oropharyngeal exudate.  Neck:     Musculoskeletal: Neck supple.  Cardiovascular:     Rate and Rhythm: Normal rate.     Heart sounds: Normal heart sounds. No murmur.  Pulmonary:     Effort: Pulmonary effort is normal.     Breath sounds: Normal breath sounds. No wheezing.  Lymphadenopathy:     Cervical: No cervical adenopathy.  Skin:    General: Skin is warm and dry.           Assessment & Plan:  Viral syndrome for the past week with secondary rhinosinusitis and bronchitis antibiotics prescribed warning signs discussed follow-up if progressive troubles no need for x-rays lab work currently

## 2018-11-23 ENCOUNTER — Ambulatory Visit (INDEPENDENT_AMBULATORY_CARE_PROVIDER_SITE_OTHER): Payer: 59 | Admitting: Family Medicine

## 2018-11-23 ENCOUNTER — Encounter: Payer: Self-pay | Admitting: Family Medicine

## 2018-11-23 VITALS — BP 132/86 | Wt 254.0 lb

## 2018-11-23 DIAGNOSIS — R030 Elevated blood-pressure reading, without diagnosis of hypertension: Secondary | ICD-10-CM | POA: Diagnosis not present

## 2018-11-23 NOTE — Progress Notes (Signed)
   Subjective:    Patient ID: Raven Burch, female    DOB: Nov 06, 1980, 38 y.o.   MRN: [id number]  Hypertension  This is a new problem. The current episode started more than 1 month ago. Pertinent negatives include no chest pain, headaches or shortness of breath. There are no compliance problems.    Pt checks BP once a week,usually at [facility]. Pt states she is not having any problems. Pt states her BP does fluctuate - usually in the 130s/80s.   Goes to the gym twice per week. Trying to eat healthier.   Review of Systems  Eyes: Negative for visual disturbance.  Respiratory: Negative for shortness of breath.   Cardiovascular: Negative for chest pain and leg swelling.  Neurological: Negative for headaches.       Objective:   Physical Exam Vitals signs and nursing note reviewed.  Constitutional:      General: She is not in acute distress.    Appearance: She is well-developed.  HENT:     Head: Normocephalic and atraumatic.  Neck:     Musculoskeletal: Neck supple.  Cardiovascular:     Rate and Rhythm: Normal rate and regular rhythm.     Heart sounds: Normal heart sounds. No murmur.  Pulmonary:     Effort: Pulmonary effort is normal. No respiratory distress.     Breath sounds: Normal breath sounds.  Skin:    General: Skin is warm and dry.  Neurological:     Mental Status: She is alert and oriented to person, place, and time.  Psychiatric:        Behavior: Behavior normal.           Assessment & Plan:  Pre-hypertension  BP elevated at 134/84 on repeat. Discussed that she is not in the range where we would recommend starting on medication to lower her BP. Discussed at length lifestyle changes are what is recommended to help prevent HTN and other chronic diseases. Encouraged DASH diet and increased physical activity. Discussed stress reduction techniques and taking time to care for herself. Pt will continue to periodically check her BPs out of the office, she is aware that  if they consistently run over 140/90 then she should f/u with [place]. Otherwise f/u at regular yearly wellness visit or sooner if needed.

## 2018-11-23 NOTE — Patient Instructions (Signed)
DASH Eating Plan  DASH stands for "Dietary Approaches to Stop Hypertension." The DASH eating plan is a healthy eating plan that has been shown to reduce high blood pressure (hypertension). It may also reduce your risk for type 2 diabetes, heart disease, and stroke. The DASH eating plan may also help with weight loss.  What are tips for following this plan?    General guidelines   Avoid eating more than 2,300 mg (milligrams) of salt (sodium) a day. If you have hypertension, you may need to reduce your sodium intake to 1,500 mg a day.   Limit alcohol intake to no more than 1 drink a day for nonpregnant women and 2 drinks a day for men. One drink equals 12 oz of beer, 5 oz of wine, or 1 oz of hard liquor.   Work with your health care provider to maintain a healthy body weight or to lose weight. Ask what an ideal weight is for you.   Get at least 30 minutes of exercise that causes your heart to beat faster (aerobic exercise) most days of the week. Activities may include walking, swimming, or biking.   Work with your health care provider or diet and nutrition specialist (dietitian) to adjust your eating plan to your individual calorie needs.  Reading food labels     Check food labels for the amount of sodium per serving. Choose foods with less than 5 percent of the Daily Value of sodium. Generally, foods with less than 300 mg of sodium per serving fit into this eating plan.   To find whole grains, look for the word "whole" as the first word in the ingredient list.  Shopping   Buy products labeled as "low-sodium" or "no salt added."   Buy fresh foods. Avoid canned foods and premade or frozen meals.  Cooking   Avoid adding salt when cooking. Use salt-free seasonings or herbs instead of table salt or sea salt. Check with your health care provider or pharmacist before using salt substitutes.   Do not fry foods. Cook foods using healthy methods such as baking, boiling, grilling, and broiling instead.   Cook with  heart-healthy oils, such as olive, canola, soybean, or sunflower oil.  Meal planning   Eat a balanced diet that includes:  ? 5 or more servings of fruits and vegetables each day. At each meal, try to fill half of your plate with fruits and vegetables.  ? Up to 6-8 servings of whole grains each day.  ? Less than 6 oz of lean meat, poultry, or fish each day. A 3-oz serving of meat is about the same size as a deck of cards. One egg equals 1 oz.  ? 2 servings of low-fat dairy each day.  ? A serving of nuts, seeds, or beans 5 times each week.  ? Heart-healthy fats. Healthy fats called Omega-3 fatty acids are found in foods such as flaxseeds and coldwater fish, like sardines, salmon, and mackerel.   Limit how much you eat of the following:  ? Canned or prepackaged foods.  ? Food that is high in trans fat, such as fried foods.  ? Food that is high in saturated fat, such as fatty meat.  ? Sweets, desserts, sugary drinks, and other foods with added sugar.  ? Full-fat dairy products.   Do not salt foods before eating.   Try to eat at least 2 vegetarian meals each week.   Eat more home-cooked food and less restaurant, buffet, and fast food.     When eating at a restaurant, ask that your food be prepared with less salt or no salt, if possible.  What foods are recommended?  The items listed may not be a complete list. Talk with your dietitian about what dietary choices are best for you.  Grains  Whole-grain or whole-wheat bread. Whole-grain or whole-wheat pasta. Brown rice. Oatmeal. Quinoa. Bulgur. Whole-grain and low-sodium cereals. Pita bread. Low-fat, low-sodium crackers. Whole-wheat flour tortillas.  Vegetables  Fresh or frozen vegetables (raw, steamed, roasted, or grilled). Low-sodium or reduced-sodium tomato and vegetable juice. Low-sodium or reduced-sodium tomato sauce and tomato paste. Low-sodium or reduced-sodium canned vegetables.  Fruits  All fresh, dried, or frozen fruit. Canned fruit in natural juice (without  added sugar).  Meat and other protein foods  Skinless chicken or turkey. Ground chicken or turkey. Pork with fat trimmed off. Fish and seafood. Egg whites. Dried beans, peas, or lentils. Unsalted nuts, nut butters, and seeds. Unsalted canned beans. Lean cuts of beef with fat trimmed off. Low-sodium, lean deli meat.  Dairy  Low-fat (1%) or fat-free (skim) milk. Fat-free, low-fat, or reduced-fat cheeses. Nonfat, low-sodium ricotta or cottage cheese. Low-fat or nonfat yogurt. Low-fat, low-sodium cheese.  Fats and oils  Soft margarine without trans fats. Vegetable oil. Low-fat, reduced-fat, or light mayonnaise and salad dressings (reduced-sodium). Canola, safflower, olive, soybean, and sunflower oils. Avocado.  Seasoning and other foods  Herbs. Spices. Seasoning mixes without salt. Unsalted popcorn and pretzels. Fat-free sweets.  What foods are not recommended?  The items listed may not be a complete list. Talk with your dietitian about what dietary choices are best for you.  Grains  Baked goods made with fat, such as croissants, muffins, or some breads. Dry pasta or rice meal packs.  Vegetables  Creamed or fried vegetables. Vegetables in a cheese sauce. Regular canned vegetables (not low-sodium or reduced-sodium). Regular canned tomato sauce and paste (not low-sodium or reduced-sodium). Regular tomato and vegetable juice (not low-sodium or reduced-sodium). Pickles. Olives.  Fruits  Canned fruit in a light or heavy syrup. Fried fruit. Fruit in cream or butter sauce.  Meat and other protein foods  Fatty cuts of meat. Ribs. Fried meat. Bacon. Sausage. Bologna and other processed lunch meats. Salami. Fatback. Hotdogs. Bratwurst. Salted nuts and seeds. Canned beans with added salt. Canned or smoked fish. Whole eggs or egg yolks. Chicken or turkey with skin.  Dairy  Whole or 2% milk, cream, and half-and-half. Whole or full-fat cream cheese. Whole-fat or sweetened yogurt. Full-fat cheese. Nondairy creamers. Whipped toppings.  Processed cheese and cheese spreads.  Fats and oils  Butter. Stick margarine. Lard. Shortening. Ghee. Bacon fat. Tropical oils, such as coconut, palm kernel, or palm oil.  Seasoning and other foods  Salted popcorn and pretzels. Onion salt, garlic salt, seasoned salt, table salt, and sea salt. Worcestershire sauce. Tartar sauce. Barbecue sauce. Teriyaki sauce. Soy sauce, including reduced-sodium. Steak sauce. Canned and packaged gravies. Fish sauce. Oyster sauce. Cocktail sauce. Horseradish that you find on the shelf. Ketchup. Mustard. Meat flavorings and tenderizers. Bouillon cubes. Hot sauce and Tabasco sauce. Premade or packaged marinades. Premade or packaged taco seasonings. Relishes. Regular salad dressings.  Where to find more information:   National Heart, Lung, and Blood Institute: www.nhlbi.nih.gov   American Heart Association: www.heart.org  Summary   The DASH eating plan is a healthy eating plan that has been shown to reduce high blood pressure (hypertension). It may also reduce your risk for type 2 diabetes, heart disease, and stroke.   With the   DASH eating plan, you should limit salt (sodium) intake to 2,300 mg a day. If you have hypertension, you may need to reduce your sodium intake to 1,500 mg a day.   When on the DASH eating plan, aim to eat more fresh fruits and vegetables, whole grains, lean proteins, low-fat dairy, and heart-healthy fats.   Work with your health care provider or diet and nutrition specialist (dietitian) to adjust your eating plan to your individual calorie needs.  This information is not intended to replace advice given to you by your health care provider. Make sure you discuss any questions you have with your health care provider.  Document Released: 09/02/2011 Document Revised: 09/06/2016 Document Reviewed: 09/06/2016  Elsevier Interactive Patient Education  2019 [employer]

## 2019-06-29 ENCOUNTER — Other Ambulatory Visit: Payer: Self-pay | Admitting: *Deleted

## 2019-06-29 DIAGNOSIS — Z20822 Contact with and (suspected) exposure to covid-19: Secondary | ICD-10-CM

## 2019-06-30 LAB — NOVEL CORONAVIRUS, NAA: SARS-CoV-2, NAA: NOT DETECTED

## 2019-10-05 ENCOUNTER — Encounter: Payer: 59 | Admitting: Nurse Practitioner

## 2019-11-23 ENCOUNTER — Ambulatory Visit (INDEPENDENT_AMBULATORY_CARE_PROVIDER_SITE_OTHER): Payer: 59 | Admitting: Nurse Practitioner

## 2019-11-23 ENCOUNTER — Encounter: Payer: Self-pay | Admitting: Nurse Practitioner

## 2019-11-23 ENCOUNTER — Other Ambulatory Visit: Payer: Self-pay

## 2019-11-23 VITALS — BP 126/80 | Temp 98.0°F | Ht 65.0 in | Wt 247.0 lb

## 2019-11-23 DIAGNOSIS — R5383 Other fatigue: Secondary | ICD-10-CM

## 2019-11-23 DIAGNOSIS — E785 Hyperlipidemia, unspecified: Secondary | ICD-10-CM

## 2019-11-23 DIAGNOSIS — Z01419 Encounter for gynecological examination (general) (routine) without abnormal findings: Secondary | ICD-10-CM | POA: Diagnosis not present

## 2019-11-23 DIAGNOSIS — E559 Vitamin D deficiency, unspecified: Secondary | ICD-10-CM | POA: Diagnosis not present

## 2019-11-23 DIAGNOSIS — R748 Abnormal levels of other serum enzymes: Secondary | ICD-10-CM | POA: Diagnosis not present

## 2019-11-23 MED ORDER — PHENTERMINE HCL 37.5 MG PO TABS: ORAL_TABLET | ORAL | 2 refills | Status: DC

## 2019-11-23 NOTE — Progress Notes (Signed)
   Subjective:    Patient ID: JAIDON SPONSEL, female    DOB: 05-11-1981, 39 y.o.   MRN: [id number]  HPI  The patient comes in today for a wellness visit.    A review of their health history was completed.  A review of medications was also completed.  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   Additional concerns: none   Review of Systems     Objective:   Physical Exam        Assessment & Plan:

## 2019-11-23 NOTE — Progress Notes (Signed)
   Subjective:    Patient ID: Raven Burch, female    DOB: 1981/06/26, 39 y.o.   MRN: [id number]  HPI Presents for a wellness exam. No complaints of pain or discomfort. Flu vaccine, vision and dental exams up to date. Denies alcohol and tobacco use. 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 She is currently not following a diet or exercise plan. Weight 247 lbs. Has taken Phentermine in the past (2017) with no noted issues and effective results. Same sex partner for 22 years and has had a tubal ligation. She deferred pap smear at this time for she is currently on cycle. States menses is regular without pain and regular flow.    Review of Systems Constitutional: Negative for chills, fatigue, fever and unexpected weight change.  Respiratory: Negative for cough, shortness of breath and wheezing.   Cardiovascular: Negative for chest pain and leg swelling. Gastrointestinal: Negative for abdominal pain, constipation, diarrhea, nausea and vomiting.  Genitourinary: Negative for difficulty urinating, hematuria, menstrual problem, pelvic pain and vaginal discharge.    Objective:   Physical Exam: Constitutional: A/O x3, she appears well-developed and well-nourished. No distress.  Respiratory: Effort normal,no respiratory distress. No wheezes, rales or rhonchi Cardiovascular: Normal rate, regular rhythm and normal heart sounds. Thyroid: No thyroid mass, goiter or tenderness   Bilateral breast without mass, discharge, and tenderness.  Lymphadenopathy: Right upper body: No supraclavicular, axillary or pectoral adenopathy. Left upper body: No supraclavicular, axillary or pectoral adenopathy. Gastrointestinal: Soft, without distension, tenderness and masses Genitourinary: Pelvic exam deferred exam at this time due to menses Skin: Skin is warm and dry.       Assessment & Plan:   Problem List Items Addressed This Visit      Other   Morbid obesity (HCC)   Relevant Medications   phentermine  (ADIPEX-P) 37.5 MG tablet   Vitamin D deficiency   Relevant Orders   VITAMIN D 25 Hydroxy (Vit-D Deficiency, Fractures)    Other Visit Diagnoses    Well woman exam    -  Primary   Elevated liver enzymes       Relevant Orders   Hepatic function panel   Dyslipidemia       Relevant Orders   Lipid panel   Basic metabolic panel   Fatigue, unspecified type       Relevant Orders   TSH   CBC with Differential/Platelet     Meds ordered this encounter  Medications  . phentermine (ADIPEX-P) 37.5 MG tablet    Sig: Take one tab po q am    Dispense:  30 tablet    Refill:  2    Order Specific Question:   Supervising Provider    Answer:   Lilyan Punt A [[id number]]    Routine labs pending Education provided on weight loss, healthy diet and exercise plan Informed patient to follow up with pharmacy for tetanus vaccine.  Follow up in three months, call back sooner if any problems.

## 2019-11-24 ENCOUNTER — Encounter: Payer: Self-pay | Admitting: Nurse Practitioner

## 2019-12-18 ENCOUNTER — Other Ambulatory Visit: Payer: Self-pay

## 2019-12-18 ENCOUNTER — Encounter: Payer: Self-pay | Admitting: Family Medicine

## 2019-12-18 ENCOUNTER — Ambulatory Visit (INDEPENDENT_AMBULATORY_CARE_PROVIDER_SITE_OTHER): Payer: 59 | Admitting: Family Medicine

## 2019-12-18 VITALS — BP 128/84 | Temp 97.4°F | Ht 65.0 in | Wt 237.0 lb

## 2019-12-18 DIAGNOSIS — M545 Low back pain, unspecified: Secondary | ICD-10-CM

## 2019-12-18 DIAGNOSIS — S39012A Strain of muscle, fascia and tendon of lower back, initial encounter: Secondary | ICD-10-CM

## 2019-12-18 LAB — POCT URINALYSIS DIPSTICK
Spec Grav, UA: <=1.005 — AB (ref 1.010–1.025)
pH, UA: 6 (ref 5.0–8.0)

## 2019-12-18 MED ORDER — NAPROXEN 500 MG PO TABS: 500.0000 mg | ORAL_TABLET | Freq: Two times a day (BID) | ORAL | 0 refills | Status: DC

## 2019-12-18 MED ORDER — CHLORZOXAZONE 500 MG PO TABS: 500.0000 mg | ORAL_TABLET | Freq: Three times a day (TID) | ORAL | 0 refills | Status: DC | PRN

## 2019-12-18 NOTE — Progress Notes (Signed)
   Subjective:    Patient ID: Raven Burch, female    DOB: 10-06-1980, 39 y.o.   MRN: [id number]  Back Pain This is a new problem. Episode onset: about one and a half weeks ago. Pain location: low back on right side. She has tried ice (ibuprofen, lemon juice) for the symptoms.  pt states it feels the same as when she had a kidney stone in December of 2018.  Low back pain discomfort on the right side not the flank does not radiate to the abdomen no hematuria no dysuria no colicky pain hurts with movement and rotation Results for orders placed or performed in visit on 12/18/19  POCT urinalysis dipstick  Result Value Ref Range   Color, UA     Clarity, UA     Glucose, UA     Bilirubin, UA     Ketones, UA     Spec Grav, UA <=1.005 (A) 1.010 - 1.025   Blood, UA     pH, UA 6.0 5.0 - 8.0   Protein, UA     Urobilinogen, UA     Nitrite, UA     Leukocytes, UA     Appearance     Odor        Review of Systems  Musculoskeletal: Positive for back pain.       Objective:   Physical Exam  low back moderate tenderness positive pain and discomfort with rotation and bending and stretching negative straight leg raise abdomen is soft no guarding or rebound       Assessment & Plan:  1. Acute right-sided low back pain without sciatica Patient with a history of a kidney stone yet urine dipstick does not show blood microscopic does not show blood and her exam points toward musculoskeletal - POCT urinalysis dipstick  2. Strain of lumbar region, initial encounter It is hard to know how this triggered but it should gradually get better with gentle range of motion exercises, core exercises, heating pads, muscle relaxers when at home caution drowsiness no driving with the medication, naproxen twice daily for the next 7 to 10 days Work excuse next couple days no heavy lifting this week no lifting greater than 10 pounds if ongoing troubles to notify us

## 2020-04-04 ENCOUNTER — Other Ambulatory Visit: Payer: Self-pay

## 2020-04-04 ENCOUNTER — Ambulatory Visit (INDEPENDENT_AMBULATORY_CARE_PROVIDER_SITE_OTHER): Payer: 59 | Admitting: Family Medicine

## 2020-04-04 ENCOUNTER — Encounter: Payer: Self-pay | Admitting: Family Medicine

## 2020-04-04 VITALS — BP 128/90 | Temp 97.3°F | Wt 238.6 lb

## 2020-04-04 DIAGNOSIS — M7711 Lateral epicondylitis, right elbow: Secondary | ICD-10-CM | POA: Diagnosis not present

## 2020-04-04 MED ORDER — MELOXICAM 15 MG PO TABS: 15.0000 mg | ORAL_TABLET | Freq: Every day | ORAL | 0 refills | Status: DC

## 2020-04-04 NOTE — Progress Notes (Signed)
   Subjective:    Patient ID: Raven Burch, female    DOB: [birth year], 39 y.o.   MRN: [id number]  HPI Patient comes in today with complaints of right elbow pain on and off for about 1 month.  Has tried ice and ibuprofen with little relief.  This patient relates elbow pain over the past month hurts with certain movements.  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  HENT: Negative for congestion and rhinorrhea.   Respiratory: Negative for cough and shortness of breath.   Cardiovascular: Negative for chest pain and leg swelling.  Gastrointestinal: Negative for abdominal pain, nausea and vomiting.  Skin: Negative for color change.  Neurological: Negative for dizziness and weakness.  Psychiatric/Behavioral: Negative for agitation and confusion.       Objective:   Physical Exam Vitals reviewed.  Constitutional:      General: She is not in acute distress. HENT:     Head: Normocephalic.  Cardiovascular:     Rate and Rhythm: Normal rate and regular rhythm.     Heart sounds: Normal heart sounds. No murmur heard.   Pulmonary:     Effort: Pulmonary effort is normal.     Breath sounds: Normal breath sounds.  Lymphadenopathy:     Cervical: No cervical adenopathy.  Neurological:     Mental Status: She is alert.  Psychiatric:        Behavior: Behavior normal.      Offered injection patient defers     Assessment & Plan:  Tennis elbow right side-brace, cool compresses/ice, 20 minutes at a time every 2-3 hours, stretching exercises pamphlet given, anti-inflammatory, if not better over the next 2 to 3 weeks consider injection and physical therapy patient to give [place] update in a few weeks

## 2020-04-11 ENCOUNTER — Encounter: Payer: Self-pay | Admitting: Family Medicine

## 2020-04-11 ENCOUNTER — Encounter: Payer: Self-pay | Admitting: Nurse Practitioner

## 2020-04-11 NOTE — Telephone Encounter (Signed)
Front staff  Please do a letter for the patient Please send it to her via MyChart  To go as follows To whom it may concern,  Raven Burch is a patient of our practice.  She has tendinitis of her elbow.  She is under our care.  Because of the tendinitis over the course of the next 2 weeks recommend no lifting greater than 15 pounds-that is with both arms at the same time.  No lifting greater than 10 pounds for her affected arm.  Her condition should gradually improve.  Sincerely,  Lilyan Punt MD

## 2020-04-11 NOTE — Telephone Encounter (Signed)
Sent letter through my chart

## 2020-04-23 ENCOUNTER — Encounter: Payer: Self-pay | Admitting: Family Medicine

## 2020-04-25 ENCOUNTER — Encounter: Payer: Self-pay | Admitting: Nurse Practitioner

## 2020-04-25 ENCOUNTER — Ambulatory Visit (INDEPENDENT_AMBULATORY_CARE_PROVIDER_SITE_OTHER): Payer: 59 | Admitting: Nurse Practitioner

## 2020-04-25 ENCOUNTER — Ambulatory Visit (HOSPITAL_COMMUNITY)
Admission: RE | Admit: 2020-04-25 | Discharge: 2020-04-25 | Disposition: A | Discharge status: Home / Self Care | Payer: 59 | Source: Ambulatory Visit | Attending: Nurse Practitioner | Admitting: Nurse Practitioner

## 2020-04-25 ENCOUNTER — Other Ambulatory Visit: Payer: Self-pay

## 2020-04-25 VITALS — BP 122/82 | Temp 97.2°F | Ht 65.0 in | Wt 238.4 lb

## 2020-04-25 DIAGNOSIS — M7712 Lateral epicondylitis, left elbow: Secondary | ICD-10-CM

## 2020-04-25 DIAGNOSIS — M79644 Pain in right finger(s): Secondary | ICD-10-CM | POA: Insufficient documentation

## 2020-04-25 NOTE — Progress Notes (Signed)
   Subjective:    Patient ID: Raven Burch, female    DOB: 1981/07/27, 39 y.o.   MRN: [id number]  HPI   Patient arrives with right pinky pain and swelling S/P fall on Monday. Patient states that she jammed her small finger on the right hand after falling.  Having pain some swelling and tenderness since then.  Has been buddy taping it to her next finger to help stabilize.  Has been taking her meloxicam and using ice applications. Was also seen for right lateral epicondylitis.  Has been wearing her armband.  Symptoms were doing better but flared up after the fall.  No shoulder or elbow pain otherwise. Review of Systems     Objective:   Physical Exam NAD.  Alert, oriented.  Calm affect.  Distinct mild edema along the small finger right hand. Limited ROM and point tenderness along the mid finger. No hand pain to palpation. Normal ROM the elbow and shoulder. Distinct tenderness just below the right lateral epicondyle.        Assessment & Plan:  Pain of finger of right hand - Plan: DG Finger Little Right  Left lateral epicondylitis  Xray of finger pending. Continue elevation, ice and Meloxicam. Will order velcro finger splint based on xray. Continue wearing arm brace. Call back in 2 weeks if no improvement in either problem.

## 2020-05-23 ENCOUNTER — Encounter: Payer: Self-pay | Admitting: Nurse Practitioner

## 2020-06-11 ENCOUNTER — Ambulatory Visit (INDEPENDENT_AMBULATORY_CARE_PROVIDER_SITE_OTHER): Payer: 59 | Admitting: Family Medicine

## 2020-06-11 ENCOUNTER — Encounter: Payer: Self-pay | Admitting: Family Medicine

## 2020-06-11 ENCOUNTER — Ambulatory Visit (HOSPITAL_COMMUNITY)
Admission: RE | Admit: 2020-06-11 | Discharge: 2020-06-11 | Disposition: A | Discharge status: Home / Self Care | Payer: 59 | Source: Ambulatory Visit | Attending: Family Medicine | Admitting: Family Medicine

## 2020-06-11 ENCOUNTER — Other Ambulatory Visit: Payer: Self-pay

## 2020-06-11 VITALS — BP 114/72 | HR 112 | Temp 97.2°F | Wt 246.2 lb

## 2020-06-11 DIAGNOSIS — S6991XD Unspecified injury of right wrist, hand and finger(s), subsequent encounter: Secondary | ICD-10-CM | POA: Diagnosis present

## 2020-06-11 DIAGNOSIS — M79641 Pain in right hand: Secondary | ICD-10-CM

## 2020-06-11 MED ORDER — MELOXICAM 15 MG PO TABS: 15.0000 mg | ORAL_TABLET | Freq: Every day | ORAL | 0 refills | Status: DC

## 2020-06-11 NOTE — Progress Notes (Signed)
Patient ID: Raven Burch, female    DOB: [birth year], 39 y.o.   MRN: [id number]   Chief Complaint  Patient presents with   jammed finger    Patient comes in to follow up on her jammed finger from July. X ray negative but finger still swollen and red and experiences sharp pain at times.    Subjective:  CC: follow-up on right little finger injury  HPI Fell on 7/29 and injured her right little finger. Seen on 7/30 at this office. Still painful, swollen, and bruised (almost 2 months later).  Medical History Raven Burch has no past medical history on file.   Outpatient Encounter Medications as of 06/11/2020  Medication Sig   chlorzoxazone (PARAFON FORTE DSC) 500 MG tablet Take 1 tablet (500 mg total) by mouth 3 (three) times daily as needed for muscle spasms.   meloxicam (MOBIC) 15 MG tablet Take 1 tablet (15 mg total) by mouth daily.   phentermine (ADIPEX-P) 37.5 MG tablet Take one tab po q am   [DISCONTINUED] meloxicam (MOBIC) 15 MG tablet Take 1 tablet (15 mg total) by mouth daily. (Patient not taking: Reported on 06/11/2020)   [DISCONTINUED] naproxen (NAPROSYN) 500 MG tablet Take 1 tablet (500 mg total) by mouth 2 (two) times daily with a meal.   No facility-administered encounter medications on file as of 06/11/2020.     Review of Systems  Constitutional: Negative.   Respiratory: Negative.   Cardiovascular: Negative.   Musculoskeletal: Positive for joint swelling.       Right little finger swollen, bruised, painful, and with limited ROM.   Neurological: Negative.   Hematological: Negative.   Psychiatric/Behavioral: Negative.      Vitals BP 114/72    Pulse (!) 112    Temp (!) 97.2 F (36.2 C)    Wt 246 lb 3.2 oz (111.7 kg)    SpO2 100%    BMI 40.97 kg/m   Objective:   Physical Exam Vitals and nursing note reviewed.  Constitutional:      Appearance: Normal appearance.  Cardiovascular:     Rate and Rhythm: Normal rate and regular rhythm.     Heart sounds: Normal  heart sounds.  Pulmonary:     Effort: Pulmonary effort is normal.     Breath sounds: Normal breath sounds.  Musculoskeletal:        General: Swelling, tenderness, deformity and signs of injury present.     Comments: Right little finger injury on 7/29. X-rays at that time negative for fracture. No improvement after conservative treatment with splinting, ice, anti inflammatories. Affecting her job performance. Limited ROM in little finger. Pulses are good to extremity. 2+ radial.    Skin:    General: Skin is warm and dry.  Neurological:     Mental Status: She is alert and oriented to person, place, and time.  Psychiatric:        Mood and Affect: Mood normal.        Behavior: Behavior normal.      Assessment and Plan   1. Finger injury, right, subsequent encounter - meloxicam (MOBIC) 15 MG tablet; Take 1 tablet (15 mg total) by mouth daily.  Dispense: 30 tablet; Refill: 0 - DG Finger Little Right   Will send for another x-ray to make sure  since there is no improvement in symptoms after 2 months. If there is not a fracture she will continue with conservative treatment: splint, anti inflammatories.  Agrees with plan of care discussed today. Understands warning  signs to seek further care: increased pain and swelling. Understands to follow-up in one month for Dr. Lilyan Punt to reexamine her elbow from another encounter. If her right little finger is not improved after consistent conservative care, will refer to ortho for further evaluation and treatment.  Dorena Bodo, FNP-C

## 2020-06-11 NOTE — Patient Instructions (Signed)
Re x-ray the right pinky finger to make sure its not fractured since its not any better in almost 2 months If not fractured, start the conservative treatment : ice and ant inflammatories and splint it again.   Tennis Elbow  Tennis elbow (lateral epicondylitis) is inflammation of tendons in your outer forearm, near your elbow. Tendons are tissues that connect muscle to bone. When you have tennis elbow, inflammation affects the tendons that you use to bend your wrist and move your hand up. Inflammation occurs in the lower part of the upper arm bone (humerus), where the tendons connect to the bone (lateral epicondyle). Tennis elbow often affects people who play tennis, but anyone may get the condition from repeatedly extending the wrist or turning the forearm. What are the causes? This condition is usually caused by repeatedly extending the wrist, turning the forearm, and using the hands. It can result from sports or work that requires repetitive forearm movements. In some cases, it may be caused by a sudden injury. What increases the risk? You are more likely to develop tennis elbow if you play tennis or another racket sport. You also have a higher risk if you frequently use your hands for work. Besides people who play tennis, others at greater risk include:  Musicians.  Carpenters, painters, and plumbers.  Cooks.  Cashiers.  People who work in [employer].  Holiday representative workers.  Butchers.  People who use computers. What are the signs or symptoms? Symptoms of this condition include:  Pain and tenderness in the forearm and the outer part of the elbow. Pain may be felt only when using the arm, or it may be there all the time.  A burning feeling that starts in the elbow and spreads down the forearm.  A weak grip in the hand. How is this diagnosed? This condition may be diagnosed based on:  Your symptoms and medical history.  A physical exam.  X-rays.  MRI. How is this  treated? Resting and icing your arm is often the first treatment. Your health care provider may also recommend:  Medicines to reduce pain and inflammation. These may be in the form of a pill, topical gels, or shots of a steroid medicine (cortisone).  An elbow strap to reduce stress on the area.  Physical therapy. This may include massage or exercises.  An elbow brace to restrict the movements that cause symptoms. If these treatments do not help relieve your symptoms, your health care provider may recommend surgery to remove damaged muscle and reattach healthy muscle to bone. Follow these instructions at home: Activity  Rest your elbow and wrist and avoid activities that cause symptoms, as told by your health care provider.  Do physical therapy exercises as instructed.  If you lift an object, lift it with your palm facing up. This reduces stress on your elbow. Lifestyle  If your tennis elbow is caused by sports, check your equipment and make sure that: ? You are using it correctly. ? It is the best fit for you.  If your tennis elbow is caused by work or computer use, take frequent breaks to stretch your arm. Talk with your manager about ways to manage your condition at work. If you have a brace:  Wear the brace or strap as told by your health care provider. Remove it only as told by your health care provider.  Loosen the brace if your fingers tingle, become numb, or turn cold and blue.  Keep the brace clean.  If the  brace is not waterproof, ask if you may remove it for bathing. If you must keep the brace on while bathing: ? Do not let it get wet. ? Cover it with a watertight covering when you take a bath or a shower. General instructions   If directed, put ice on the painful area: ? Put ice in a plastic bag. ? Place a towel between your skin and the bag. ? Leave the ice on for 20 minutes, 2-3 times a day.  Take over-the-counter and prescription medicines only as told by  your health care provider.  Keep all follow-up visits as told by your health care provider. This is important. Contact a health care provider if:  You have pain that gets worse or does not get better with treatment.  You have numbness or weakness in your forearm, hand, or fingers. Summary  Tennis elbow (lateral epicondylitis) is inflammation of tendons in your outer forearm, near your elbow.  Common symptoms include pain and tenderness in your forearm and the outer part of your elbow.  This condition is usually caused by repeatedly extending your wrist, turning your forearm, and using your hands.  The first treatment is often resting and icing your arm to relieve symptoms. Further treatment may include taking medicine, getting physical therapy, wearing a brace or strap, or having surgery. This information is not intended to replace advice given to you by your health care provider. Make sure you discuss any questions you have with your health care provider. Document Revised: 06/09/2018 Document Reviewed: 06/28/2017 Elsevier Patient Education  2020 [employer].

## 2020-06-12 ENCOUNTER — Encounter: Payer: Self-pay | Admitting: Family Medicine

## 2020-06-12 ENCOUNTER — Other Ambulatory Visit: Payer: Self-pay | Admitting: *Deleted

## 2020-06-12 DIAGNOSIS — T148XXA Other injury of unspecified body region, initial encounter: Secondary | ICD-10-CM

## 2020-06-12 NOTE — Telephone Encounter (Signed)
Nurses Please interact with family/patient  I looked at yesterday's x-ray I also looked at the previous x-ray.  The first x-ray showed some swelling in that area but did not show a fracture  The most recent x-ray shows a very small avulsion fracture this is where a small piece of the bone is pulled off of the surface from an injury to the ligament.  It does not appear to be displaced.  At this point with the patient still having discomfort I would recommend we get her in with orthopedics.  More than likely they will talk about splinting and as it heals doing either home exercises or physical therapy.  Please try to get her in with that the next week with the orthopedic specialist

## 2020-06-12 NOTE — Telephone Encounter (Signed)
Discussed with pt. Pt verbalized understanding and referral put in.

## 2020-06-12 NOTE — Telephone Encounter (Signed)
Seen yesterday for finger injury and pt saw xray results on mychart that she has a fracture and wants to know what she needs to do. Please see xray results.

## 2020-06-13 ENCOUNTER — Telehealth: Payer: Self-pay | Admitting: Family Medicine

## 2020-06-13 NOTE — Telephone Encounter (Signed)
Pt left message on voicemail stating that she has received an appt with the specialist but they are needing her xrays on CD. Try to call patient back to get more information but had to leave voicemail.

## 2020-06-13 NOTE — Telephone Encounter (Signed)
Pt returned call. Pt states that specialist is wanting her xrays on disc. Informed pt to call [facility] where xrays were taken and see if they can put images on a disc. [facility] number and pt states she will call over there.

## 2020-07-17 ENCOUNTER — Encounter: Payer: Self-pay | Admitting: Family Medicine

## 2020-07-17 ENCOUNTER — Other Ambulatory Visit: Payer: Self-pay

## 2020-07-17 ENCOUNTER — Ambulatory Visit (INDEPENDENT_AMBULATORY_CARE_PROVIDER_SITE_OTHER): Payer: 59 | Admitting: Family Medicine

## 2020-07-17 VITALS — BP 130/78 | HR 98 | Temp 96.3°F | Wt 246.6 lb

## 2020-07-17 DIAGNOSIS — M7711 Lateral epicondylitis, right elbow: Secondary | ICD-10-CM

## 2020-07-17 NOTE — Progress Notes (Signed)
° °  Subjective:    Patient ID: Raven Burch, female    DOB: [birth year], 39 y.o.   MRN: [id number]  HPI Pt here for recheck on right elbow pain. Pt states it started in her finger and has now moved to the upper part of arm. No swelling or tenderness in elbow region. Pt has been using ice and Mobic.  Patient states the pain is now radiating into the lower bicep.  She also states at times it feels funny in her arm.  Denies any weakness but does hurt to move.  She also relates tenderness and pain in the elbow with some stiffness when she tries to get up and pushes with her arm it hurts  She is supposed to see orthopedics tomorrow regarding a finger fall Review of Systems     Objective:   Physical Exam On examination she has tenderness in the right elbow that extends into the biceps tendon but also is on the lateral epicondylar region       Assessment & Plan:  Lateral epicondylitis Stretching exercises cool compresses Continue anti-inflammatory See orthopedics tomorrow for injection Hopefully they can do an injection to help alleviate her issue Continue with tennis elbow brace

## 2020-07-18 ENCOUNTER — Telehealth: Payer: Self-pay | Admitting: Family Medicine

## 2020-07-18 ENCOUNTER — Encounter: Payer: Self-pay | Admitting: Family Medicine

## 2020-07-18 NOTE — Telephone Encounter (Signed)
[facility] I dictated a letter regarding this patient to Dr Melvyn Novas a orthopedic surgeon in [place] with [facility].  Please call [facility].  Alert them that we are faxing a letter along with her visit from yesterday that pertains to the patient's visit this afternoon with the specialist  Then go ahead and please fax yesterday's dictation plus today's letter  Thank you-Dr. Lorin Picket

## 2020-07-22 ENCOUNTER — Telehealth: Payer: Self-pay | Admitting: *Deleted

## 2020-07-22 NOTE — Telephone Encounter (Signed)
Dr Lorin Picket wants to know if Dr Orlan Leavens evaluated patient's elbow for injection (form at nurses station)  Left message to return call

## 2020-07-22 NOTE — Telephone Encounter (Signed)
Pt called but no one available to answer call. Call patient back but had to leave message to return call

## 2020-07-22 NOTE — Telephone Encounter (Signed)
Pt returned call. Pt states that Dr.Ortman did evaluate her shoulder but states that the pain was more in bicep area but Dr.Ortman does not do injections in bicep due to it can mess up tendons.

## 2020-07-22 NOTE — Telephone Encounter (Signed)
Ok thanks 

## 2020-07-30 ENCOUNTER — Other Ambulatory Visit: Payer: Self-pay

## 2020-07-30 ENCOUNTER — Ambulatory Visit (INDEPENDENT_AMBULATORY_CARE_PROVIDER_SITE_OTHER): Payer: 59 | Admitting: Family Medicine

## 2020-07-30 ENCOUNTER — Encounter: Payer: Self-pay | Admitting: Family Medicine

## 2020-07-30 VITALS — HR 138 | Temp 97.8°F

## 2020-07-30 DIAGNOSIS — J069 Acute upper respiratory infection, unspecified: Secondary | ICD-10-CM | POA: Diagnosis not present

## 2020-07-30 DIAGNOSIS — H66001 Acute suppurative otitis media without spontaneous rupture of ear drum, right ear: Secondary | ICD-10-CM | POA: Diagnosis not present

## 2020-07-30 MED ORDER — AMOXICILLIN 500 MG PO CAPS: 500.0000 mg | ORAL_CAPSULE | Freq: Three times a day (TID) | ORAL | 0 refills | Status: DC

## 2020-07-30 NOTE — Progress Notes (Signed)
Patient ID: Raven Burch, female    DOB: 07-15-1981, 39 y.o.   MRN: [id number]   Chief Complaint  Patient presents with  . Cough   Subjective:    Cough This is a new problem. Episode onset: 1.5 weeks. Associated symptoms include ear pain, a fever, nasal congestion and postnasal drip. Pertinent negatives include no chills, headaches, rhinorrhea, sore throat or shortness of breath. Treatments tried: dayquil, nyquil. The treatment provided no relief.   Pt having ear pain on rt.  Fever, nasal congestion and coughing.  Tried otc meds. No sick contacts. No known covid contacts. No covid vaccines.  Medical History [facility] has no past medical history on file.   Outpatient Encounter Medications as of 07/30/2020  Medication Sig  . amoxicillin (AMOXIL) 500 MG capsule Take 1 capsule (500 mg total) by mouth 3 (three) times daily.  . meloxicam (MOBIC) 15 MG tablet Take 1 tablet (15 mg total) by mouth daily. (Patient not taking: Reported on 07/30/2020)   No facility-administered encounter medications on file as of 07/30/2020.     Review of Systems  Constitutional: Positive for fever. Negative for chills.  HENT: Positive for congestion, ear pain and postnasal drip. Negative for rhinorrhea, sinus pressure, sinus pain and sore throat.   Eyes: Negative for pain, discharge and itching.  Respiratory: Positive for cough. Negative for shortness of breath.   Gastrointestinal: Negative for constipation, diarrhea, nausea and vomiting.  Neurological: Negative for headaches.     Vitals T-98.63F, HR 96, RR -18, ox %- 99%  Objective:   Physical Exam Vitals and nursing note reviewed.  Constitutional:      General: She is not in acute distress.    Appearance: Normal appearance. She is not toxic-appearing.  HENT:     Head: Normocephalic and atraumatic.     Right Ear: Ear canal and external ear normal.     Left Ear: Tympanic membrane, ear canal and external ear normal.     Ears:     Comments:  +erythema on rt tm and injection with purulent effusion.     Nose: Rhinorrhea (clear) present. No congestion.     Mouth/Throat:     Mouth: Mucous membranes are moist.     Pharynx: Oropharynx is clear. No oropharyngeal exudate or posterior oropharyngeal erythema.  Eyes:     Extraocular Movements: Extraocular movements intact.     Conjunctiva/sclera: Conjunctivae normal.     Pupils: Pupils are equal, round, and reactive to light.  Pulmonary:     Effort: Pulmonary effort is normal. No respiratory distress.  Musculoskeletal:     Cervical back: Normal range of motion.  Lymphadenopathy:     Cervical: No cervical adenopathy.  Skin:    General: Skin is warm and dry.     Findings: No rash.  Neurological:     Mental Status: She is alert and oriented to person, place, and time.  Psychiatric:        Mood and Affect: Mood normal.        Behavior: Behavior normal.     Assessment and Plan   1. Non-recurrent acute suppurative otitis media of right ear without spontaneous rupture of tympanic membrane - amoxicillin (AMOXIL) 500 MG capsule; Take 1 capsule (500 mg total) by mouth 3 (three) times daily.  Dispense: 30 capsule; Refill: 0  2. Upper respiratory tract infection, unspecified type - Novel Coronavirus, NAA ([facility])   OM- amoxicillin for 10 days. covid test pending. Use flonase, tylenol/ibuprofen prn. Call or rto if not  improving.  F/u prn.

## 2020-08-01 LAB — SARS-COV-2, NAA 2 DAY TAT

## 2020-08-01 LAB — NOVEL CORONAVIRUS, NAA: SARS-CoV-2, NAA: NOT DETECTED

## 2020-08-10 ENCOUNTER — Encounter: Payer: Self-pay | Admitting: Family Medicine

## 2020-10-15 ENCOUNTER — Other Ambulatory Visit: Payer: Self-pay

## 2020-10-15 ENCOUNTER — Ambulatory Visit (INDEPENDENT_AMBULATORY_CARE_PROVIDER_SITE_OTHER): Payer: BC Managed Care – PPO | Admitting: Family Medicine

## 2020-10-15 DIAGNOSIS — J019 Acute sinusitis, unspecified: Secondary | ICD-10-CM | POA: Diagnosis not present

## 2020-10-15 DIAGNOSIS — R059 Cough, unspecified: Secondary | ICD-10-CM | POA: Diagnosis not present

## 2020-10-15 MED ORDER — AMOXICILLIN-POT CLAVULANATE 875-125 MG PO TABS: 1.0000 | ORAL_TABLET | Freq: Two times a day (BID) | ORAL | 0 refills | Status: DC

## 2020-10-15 NOTE — Progress Notes (Signed)
   Subjective:    Patient ID: Raven Burch, female    DOB: 1980/12/05, 40 y.o.   MRN: [id number]  Sinusitis This is a new problem. Episode onset: Last tuesday. Maximum temperature: highest is 99.6. Associated symptoms include congestion, coughing, ear pain, headaches, sinus pressure and a sore throat. Pertinent negatives include no shortness of breath. Treatments tried: dayquil and nyquil.   PMH benign   Review of Systems  Constitutional: Negative for activity change and fever.  HENT: Positive for congestion, ear pain, rhinorrhea, sinus pressure and sore throat.   Eyes: Negative for discharge.  Respiratory: Positive for cough. Negative for shortness of breath and wheezing.   Cardiovascular: Negative for chest pain.  Neurological: Positive for headaches.       Objective:   Physical Exam Vitals and nursing note reviewed.  Constitutional:      Appearance: She is well-developed.  HENT:     Head: Normocephalic.     Nose: Nose normal.     Mouth/Throat:     Mouth: Oropharynx is clear and moist.     Pharynx: No oropharyngeal exudate.  Cardiovascular:     Rate and Rhythm: Normal rate.     Heart sounds: Normal heart sounds. No murmur heard.   Pulmonary:     Effort: Pulmonary effort is normal.     Breath sounds: Normal breath sounds. No wheezing.  Musculoskeletal:     Cervical back: Neck supple.  Lymphadenopathy:     Cervical: No cervical adenopathy.  Skin:    General: Skin is warm and dry.           Assessment & Plan:  Patient was seen today for upper respiratory illness. It is felt that the patient is dealing with sinusitis.  Antibiotics were prescribed today.  Compliance discussed.  If worsening symptoms or progressive illness notify us.  If emergency call 911 or go to ER.  Possible COVID antibiotic was sent in for the sinus infection Await COVID test Rest up over the next few days Warning signs discussed

## 2020-10-17 ENCOUNTER — Encounter: Payer: Self-pay | Admitting: Family Medicine

## 2020-10-17 LAB — SARS-COV-2, NAA 2 DAY TAT

## 2020-10-17 LAB — NOVEL CORONAVIRUS, NAA: SARS-CoV-2, NAA: DETECTED — AB

## 2020-10-20 NOTE — Telephone Encounter (Signed)
Front Please connect with mom I recommend that the appointment be moved back 1 week.  Next Tuesday I could see him at 11 AM

## 2020-11-28 ENCOUNTER — Ambulatory Visit (INDEPENDENT_AMBULATORY_CARE_PROVIDER_SITE_OTHER): Payer: BC Managed Care – PPO | Admitting: Nurse Practitioner

## 2020-11-28 ENCOUNTER — Other Ambulatory Visit: Payer: Self-pay

## 2020-11-28 ENCOUNTER — Encounter: Payer: Self-pay | Admitting: Nurse Practitioner

## 2020-11-28 VITALS — BP 136/82 | HR 95 | Temp 97.1°F | Ht 65.0 in | Wt 250.8 lb

## 2020-11-28 DIAGNOSIS — E282 Polycystic ovarian syndrome: Secondary | ICD-10-CM | POA: Diagnosis not present

## 2020-11-28 DIAGNOSIS — R5383 Other fatigue: Secondary | ICD-10-CM

## 2020-11-28 DIAGNOSIS — E785 Hyperlipidemia, unspecified: Secondary | ICD-10-CM

## 2020-11-28 DIAGNOSIS — Z01419 Encounter for gynecological examination (general) (routine) without abnormal findings: Secondary | ICD-10-CM | POA: Diagnosis not present

## 2020-11-28 DIAGNOSIS — I10 Essential (primary) hypertension: Secondary | ICD-10-CM | POA: Diagnosis not present

## 2020-11-28 DIAGNOSIS — Z1159 Encounter for screening for other viral diseases: Secondary | ICD-10-CM

## 2020-11-28 DIAGNOSIS — Z124 Encounter for screening for malignant neoplasm of cervix: Secondary | ICD-10-CM

## 2020-11-28 DIAGNOSIS — Z1151 Encounter for screening for human papillomavirus (HPV): Secondary | ICD-10-CM

## 2020-11-28 MED ORDER — LISINOPRIL 5 MG PO TABS: 5.0000 mg | ORAL_TABLET | Freq: Every day | ORAL | 2 refills | Status: DC

## 2020-11-28 NOTE — Progress Notes (Signed)
   Subjective:    Patient ID: Raven Burch, female    DOB: Nov 26, 1980, 40 y.o.   MRN: [id number]  HPI  The patient comes in today for a wellness visit.    A review of their health history was completed.  A review of medications was also completed.  Any needed refills; no  Eating habits: eating pretty godd- needs to avoid junk food  Falls/  MVA accidents in past few months: none  Regular exercise: ocassionally  Specialist pt sees on regular basis: no  Preventative health issues were discussed.   Additional concerns: follow up on blood pressure   Review of Systems     Objective:   Physical Exam        Assessment & Plan:

## 2020-11-28 NOTE — Progress Notes (Addendum)
Subjective:    Patient ID: Raven Burch, female    DOB: Feb 03, 1981, 40 y.o.   MRN: [id number]  HPI   The patient comes in today for a wellness visit.   Concerned about high blood pressure. SBP has been consistently running in the 130's to 140's  and DBP in the high 80's over the past year. Denies feeling stressed. Denies drinking excess caffeine. Drinks 1 soda daily. Physically active while at work. Admits to eating too much junk food. Only takes vit D, zinc, and vitamin C supplements.   Menstruation is regular with normal flow. Same sexual partner for the past 23 years. Had a tubal ligation for birth control.  Declines covid vaccine. Has up coming eye and dental appointments.    A review of their health history was completed.  A review of medications was also completed.  Any needed refills; no  Eating habits: eating pretty good- needs to avoid junk food  Falls/  MVA accidents in past few months: none  Regular exercise: ocassionally  Specialist pt sees on regular basis: no  Preventative health issues were discussed.   Additional concerns: follow up on blood pressure    Review of Systems  Constitutional: Negative for activity change, appetite change and fever.  Respiratory: Negative for cough, chest tightness, shortness of breath and wheezing.   Cardiovascular: Negative for chest pain and leg swelling.  Gastrointestinal: Negative for abdominal pain, blood in stool, constipation, diarrhea, nausea and vomiting.  Endocrine: Negative for polydipsia, polyphagia and polyuria.  Genitourinary: Negative for difficulty urinating, dysuria, enuresis, frequency, genital sores, menstrual problem, pelvic pain, urgency and vaginal discharge.       Flowsheet Row Office Visit from 11/28/2020 in [facility]  PHQ-9 Total Score 2      Objective:   Physical Exam Constitutional:      General: She is not in acute distress.    Appearance: Normal appearance. She is  obese. She is not ill-appearing.  Neck:     Comments: Thyroid non tender; no mass or goiter noted.  Cardiovascular:     Rate and Rhythm: Normal rate and regular rhythm.     Pulses: Normal pulses.     Heart sounds: Normal heart sounds. No murmur heard.   Pulmonary:     Effort: Pulmonary effort is normal.     Breath sounds: Normal breath sounds. No wheezing.  Chest:  Breasts:     Right: No swelling, inverted nipple, mass, skin change, tenderness, axillary adenopathy or supraclavicular adenopathy.     Left: No inverted nipple, mass, skin change, tenderness, axillary adenopathy or supraclavicular adenopathy.    Abdominal:     General: There is no distension.     Palpations: Abdomen is soft.     Tenderness: There is no abdominal tenderness.  Genitourinary:    Vagina: No vaginal discharge.     Comments: External GU: no rashes or lesions. Vagina: pink, no discharge. Cervix normal in appearance. No CMT. Bimanual exam: no tenderness or obvious masses; exam limited due to abdominal girth.  Musculoskeletal:     Right lower leg: No edema.     Left lower leg: No edema.  Lymphadenopathy:     Cervical: No cervical adenopathy.     Upper Body:     Right upper body: No supraclavicular, axillary or pectoral adenopathy.     Left upper body: No supraclavicular, axillary or pectoral adenopathy.  Neurological:     Mental Status: She is alert and oriented to person, place,  and time.  Psychiatric:        Mood and Affect: Mood normal.        Behavior: Behavior normal.        Thought Content: Thought content normal.        Judgment: Judgment normal.     Today's Vitals   11/28/20 1512  BP: 136/82  Pulse: 95  Temp: (!) 97.1 F (36.2 C)  TempSrc: Oral  SpO2: 98%  Weight: 250 lb 12.8 oz (113.8 kg)  Height: 5\' 5"  (1.651 m)   Body mass index is 41.74 kg/m.       Assessment & Plan:   Problem List Items Addressed This Visit      Cardiovascular and Mediastinum   Essential hypertension    Relevant Medications   lisinopril (ZESTRIL) 5 MG tablet   Other Relevant Orders   CBC with Differential/Platelet   Comprehensive metabolic panel     Endocrine   PCOS (polycystic ovarian syndrome)   Relevant Orders   Comprehensive metabolic panel     Other   Morbid obesity (HCC)    Other Visit Diagnoses    Well woman exam    -  Primary   Relevant Orders   IGP, Aptima HPV   Dyslipidemia       Relevant Orders   Lipid panel   Encounter for hepatitis C screening test for low risk patient       Relevant Orders   Hepatitis C Antibody   Fatigue, unspecified type       Relevant Orders   CBC with Differential/Platelet   Comprehensive metabolic panel   TSH   Screening for HPV (human papillomavirus)       Relevant Orders   IGP, Aptima HPV   Screening for cervical cancer       Relevant Orders   IGP, Aptima HPV     Meds ordered this encounter  Medications  . lisinopril (ZESTRIL) 5 MG tablet    Sig: Take 1 tablet (5 mg total) by mouth daily. For blood pressure    Dispense:  30 tablet    Refill:  2    Order Specific Question:   Supervising Provider    Answer:   Babs Sciara   Plan/ Education:  A goal of 5-10 lbs before next follow up visit  Recommend slowly decreasing the consumption of junk food and snacks. Avoid having them in the home will help.   Discussed programs to help with weight loss that can help to track the types of food and calories consumed daily.   Hold on weight loss medicine at this time.   Start on low dose Lisinopril as directed. Reviewed potential adverse effects. DC med and contact office if any problems.   Continue checking BP on a regular basis and document readings.  Labs pending.   Defers mammogram. No family history of breast cancer.  Follow up in 3 months for Bp med check up

## 2020-11-29 ENCOUNTER — Encounter: Payer: Self-pay | Admitting: Nurse Practitioner

## 2020-11-30 ENCOUNTER — Encounter: Payer: Self-pay | Admitting: Nurse Practitioner

## 2020-12-02 LAB — CBC WITH DIFFERENTIAL/PLATELET
Basophils Absolute: 0.1 10*3/uL (ref 0.0–0.2)
Basos: 1 %
EOS (ABSOLUTE): 0.1 10*3/uL (ref 0.0–0.4)
Eos: 1 %
Hematocrit: 41.9 % (ref 34.0–46.6)
Hemoglobin: 14.6 g/dL (ref 11.1–15.9)
Immature Grans (Abs): 0 10*3/uL (ref 0.0–0.1)
Immature Granulocytes: 0 %
Lymphocytes Absolute: 2.1 10*3/uL (ref 0.7–3.1)
Lymphs: 26 %
MCH: 31.1 pg (ref 26.6–33.0)
MCHC: 34.8 g/dL (ref 31.5–35.7)
MCV: 89 fL (ref 79–97)
Monocytes Absolute: 0.4 10*3/uL (ref 0.1–0.9)
Monocytes: 5 %
Neutrophils Absolute: 5.7 10*3/uL (ref 1.4–7.0)
Neutrophils: 67 %
Platelets: 355 10*3/uL (ref 150–450)
RBC: 4.69 x10E6/uL (ref 3.77–5.28)
RDW: 11.9 % (ref 11.7–15.4)
WBC: 8.4 10*3/uL (ref 3.4–10.8)

## 2020-12-02 LAB — IGP, APTIMA HPV: HPV Aptima: NEGATIVE

## 2020-12-02 LAB — TSH: TSH: 3.55 u[IU]/mL (ref 0.450–4.500)

## 2020-12-02 LAB — COMPREHENSIVE METABOLIC PANEL
ALT: 30 IU/L (ref 0–32)
AST: 18 IU/L (ref 0–40)
Albumin/Globulin Ratio: 1.9 (ref 1.2–2.2)
Albumin: 4.3 g/dL (ref 3.8–4.8)
Alkaline Phosphatase: 109 IU/L (ref 44–121)
BUN/Creatinine Ratio: 10 (ref 9–23)
BUN: 9 mg/dL (ref 6–24)
Bilirubin Total: 0.5 mg/dL (ref 0.0–1.2)
CO2: 19 mmol/L — ABNORMAL LOW (ref 20–29)
Calcium: 9.7 mg/dL (ref 8.7–10.2)
Chloride: 102 mmol/L (ref 96–106)
Creatinine, Ser: 0.89 mg/dL (ref 0.57–1.00)
Globulin, Total: 2.3 g/dL (ref 1.5–4.5)
Glucose: 111 mg/dL — ABNORMAL HIGH (ref 65–99)
Potassium: 4.3 mmol/L (ref 3.5–5.2)
Sodium: 141 mmol/L (ref 134–144)
Total Protein: 6.6 g/dL (ref 6.0–8.5)
eGFR: 84 mL/min/{1.73_m2} (ref >59)

## 2020-12-02 LAB — LIPID PANEL
Chol/HDL Ratio: 5.2 ratio — ABNORMAL HIGH (ref 0.0–4.4)
Cholesterol, Total: 171 mg/dL (ref 100–199)
HDL: 33 mg/dL — ABNORMAL LOW (ref >39)
LDL Chol Calc (NIH): 112 mg/dL — ABNORMAL HIGH (ref 0–99)
Triglycerides: 146 mg/dL (ref 0–149)
VLDL Cholesterol Cal: 26 mg/dL (ref 5–40)

## 2020-12-02 LAB — HEPATITIS C ANTIBODY: Hep C Virus Ab: <0.1 s/co ratio (ref 0.0–0.9)

## 2021-02-04 ENCOUNTER — Other Ambulatory Visit: Payer: Self-pay

## 2021-02-04 ENCOUNTER — Ambulatory Visit (INDEPENDENT_AMBULATORY_CARE_PROVIDER_SITE_OTHER): Payer: BC Managed Care – PPO | Admitting: Family Medicine

## 2021-02-04 ENCOUNTER — Encounter: Payer: Self-pay | Admitting: Family Medicine

## 2021-02-04 VITALS — BP 128/82 | HR 96 | Temp 97.9°F | Wt 250.4 lb

## 2021-02-04 DIAGNOSIS — J029 Acute pharyngitis, unspecified: Secondary | ICD-10-CM | POA: Insufficient documentation

## 2021-02-04 DIAGNOSIS — J020 Streptococcal pharyngitis: Secondary | ICD-10-CM | POA: Insufficient documentation

## 2021-02-04 LAB — POCT RAPID STREP A (OFFICE): Rapid Strep A Screen: POSITIVE — AB

## 2021-02-04 MED ORDER — PENICILLIN V POTASSIUM 500 MG PO TABS: 500.0000 mg | ORAL_TABLET | Freq: Three times a day (TID) | ORAL | 0 refills | Status: AC

## 2021-02-04 NOTE — Progress Notes (Signed)
Patient ID: Raven Burch, female    DOB: 10/10/1980, 40 y.o.   MRN: [id number]   Chief Complaint  Patient presents with  . Cough   Subjective:  CC: cough, congestion, runny nose, ears clogged   This is a new problem.  Presents today with a complaint of cough, runny nose, congestion, and feeling like right ear is clogged.  Symptoms have been present for 1 month.  Reports that she thought it was her allergies, tried Benadryl and switch to Claritin.  Has also had multiple cough medications.  Denies fever, chills, chest pain, shortness of breath.  Endorses faint right ear pain, sinus pain and pressure and cough.  Pt having cough, runny nose, head congestion and ears feel like they are clogged. Has been going on about one month. Has taken Claritin, Benadryl and multiple rounds of cough meds with no relief.     Medical History Raven Burch has no past medical history on file.   Outpatient Encounter Medications as of 02/04/2021  Medication Sig  . lisinopril (ZESTRIL) 5 MG tablet Take 1 tablet (5 mg total) by mouth daily. For blood pressure  . penicillin v potassium (VEETID) 500 MG tablet Take 1 tablet (500 mg total) by mouth 3 (three) times daily for 10 days.   No facility-administered encounter medications on file as of 02/04/2021.     Review of Systems  Constitutional: Negative for chills and fever.  HENT: Positive for ear pain (faint throb and feels full), rhinorrhea, sinus pressure, sinus pain and sore throat (upon awakening).   Respiratory: Positive for cough. Negative for shortness of breath.   Cardiovascular: Negative for chest pain.  Gastrointestinal: Negative for abdominal pain.  Neurological: Negative for headaches.     Vitals BP 128/82   Pulse 96   Temp 97.9 F (36.6 C)   Wt 250 lb 6.4 oz (113.6 kg)   SpO2 99%   BMI 41.67 kg/m   Objective:   Physical Exam Vitals and nursing note reviewed.  Constitutional:      Appearance: Normal appearance. She is not  ill-appearing.  HENT:     Right Ear: Tympanic membrane is erythematous.     Left Ear: Tympanic membrane normal.     Nose:     Right Turbinates: Not swollen.     Left Turbinates: Not swollen.     Right Sinus: No maxillary sinus tenderness or frontal sinus tenderness.     Left Sinus: No maxillary sinus tenderness or frontal sinus tenderness.     Mouth/Throat:     Pharynx: Uvula midline. Posterior oropharyngeal erythema present. No oropharyngeal exudate.  Cardiovascular:     Rate and Rhythm: Normal rate and regular rhythm.     Heart sounds: Normal heart sounds.  Pulmonary:     Effort: Pulmonary effort is normal.     Breath sounds: Normal breath sounds.  Skin:    General: Skin is warm and dry.  Neurological:     General: No focal deficit present.     Mental Status: She is alert.  Psychiatric:        Behavior: Behavior normal.     Results for orders placed or performed in visit on 02/04/21  POCT rapid strep A  Result Value Ref Range   Rapid Strep A Screen Positive (A) Negative    Assessment and Plan   1. Sore throat - POCT rapid strep A - Novel Coronavirus, NAA ([facility])  2. Streptococcal sore throat - penicillin v potassium (VEETID) 500 MG tablet; Take  1 tablet (500 mg total) by mouth 3 (three) times daily for 10 days.  Dispense: 30 tablet; Refill: 0     Rapid strep positive, will treat with antibiotic for 10 days.  Recommend supportive therapy, adequate hydration.           Agrees with plan of care discussed today. Understands warning signs to seek further care: chest pain, shortness of breath, any significant change in health.  Understands to follow-up if symptoms do not improve or worsen. Take all of the antibiotic. Work note provided. Information provided at discharge.                           Novella Olive, NP 02/04/2021

## 2021-02-04 NOTE — Patient Instructions (Signed)
Strep Throat, Adult Strep throat is an infection in the throat that is caused by bacteria. It is common during the cold months of the year. It mostly affects children who are 5-40 years old. However, people of all ages can get it at any time of the year. This infection spreads from person to person (is contagious) through coughing, sneezing, or having close contact. Your health care provider may use other names to describe the infection. It can be called tonsillitis (if there is swelling of the tonsils), or pharyngitis (if there is swelling at the back of the throat). What are the causes? This condition is caused by the Streptococcus pyogenes bacteria. What increases the risk? You are more likely to develop this condition if:  You care for school-age children, or are around school-age children. Children are more likely to get strep throat and may spread it to others.  You spend time in crowded places where the infection can spread easily.  You have close contact with someone who has strep throat. What are the signs or symptoms? Symptoms of this condition include:  Fever or chills.  Redness, swelling, or pain in the tonsils or throat.  Pain or difficulty when swallowing.  White or yellow spots on the tonsils or throat.  Tender glands in the neck and under the jaw.  Bad smelling breath.  Red rash all over the body. This is rare. How is this diagnosed? This condition is diagnosed by tests that check for the presence and the amount of bacteria that cause strep throat. They are:  Rapid strep test. Your throat is swabbed and checked for the presence of bacteria. Results are usually ready in minutes.  Throat culture test. Your throat is swabbed. The sample is placed in a cup that allows infections to grow. Results are usually ready in 1 or 2 days.   How is this treated? This condition may be treated with:  Medicines that kill germs (antibiotics).  Medicines that relieve pain or  fever. These include: ? Ibuprofen or acetaminophen. ? Aspirin, only for patients who are over the age of 18. ? Throat lozenges. ? Throat sprays. Follow these instructions at home: Medicines  Take over-the-counter and prescription medicines only as told by your health care provider.  Take your antibiotic medicine as told by your health care provider. Do not stop taking the antibiotic even if you start to feel better.   Eating and drinking  If you have trouble swallowing, try eating soft foods until your sore throat feels better.  Drink enough fluid to keep your urine pale yellow.  To help relieve pain, you may have: ? Warm fluids, such as soup and tea. ? Cold fluids, such as frozen desserts or popsicles.   General instructions  Gargle with a salt-water mixture 3-4 times a day or as needed. To make a salt-water mixture, completely dissolve -1 tsp (3-6 g) of salt in 1 cup (237 mL) of warm water.  Get plenty of rest.  Stay home from work or school until you have been taking antibiotics for 24 hours.  Avoid smoking or being around people who smoke.  Keep all follow-up visits as told by your health care provider. This is important. How is this prevented?  Do not share food, drinking cups, or personal items that could cause the infection to spread to other people.  Wash your hands well with soap and water, and make sure that all people in your house wash their hands well.  Have family   members tested if they have a sore throat or fever. They may need an antibiotic if they have strep throat.   Contact a health care provider if:  The glands in your neck continue to get bigger.  You develop a rash, cough, or earache.  You cough up a thick mucus that is green, yellow-brown, or bloody.  You have pain or discomfort that does not get better with medicine.  Your symptoms seem to be getting worse and not better.  You have a fever. Get help right away if:  You have new symptoms,  such as vomiting, severe headache, stiff or painful neck, chest pain, or shortness of breath.  You have severe throat pain, drooling, or changes in your voice.  You have swelling of the neck, or the skin on the neck becomes red and tender.  You have signs of dehydration, such as tiredness (fatigue), dry mouth, and decreased urination.  You become increasingly sleepy, or you cannot wake up completely.  Your joints become red or painful. Summary  Strep throat is an infection in the throat that is caused by the Streptococcus pyogenes bacteria. This infection is spread from person to person (is contagious) through coughing, sneezing, or having close contact.  Take your medicines, including antibiotics, as told by your health care provider. Do not stop taking the antibiotic even if you start to feel better.  To prevent the spread of germs, wash your hands well with soap and water. Have others do the same. Do not share food, drinking cups, or personal items.  Get help right away if you have new symptoms, such as vomiting, severe headache, stiff or painful neck, chest pain, or shortness of breath. This information is not intended to replace advice given to you by your health care provider. Make sure you discuss any questions you have with your health care provider. Document Revised: 12/01/2018 Document Reviewed: 12/01/2018 Elsevier Patient Education  2021 [employer]  

## 2021-02-05 LAB — NOVEL CORONAVIRUS, NAA: SARS-CoV-2, NAA: NOT DETECTED

## 2021-02-05 LAB — SARS-COV-2, NAA 2 DAY TAT

## 2021-02-27 ENCOUNTER — Ambulatory Visit: Payer: BC Managed Care – PPO | Admitting: Nurse Practitioner

## 2021-02-27 ENCOUNTER — Encounter: Payer: Self-pay | Admitting: Nurse Practitioner

## 2021-02-27 ENCOUNTER — Other Ambulatory Visit: Payer: Self-pay

## 2021-02-27 ENCOUNTER — Ambulatory Visit (INDEPENDENT_AMBULATORY_CARE_PROVIDER_SITE_OTHER): Payer: BC Managed Care – PPO | Admitting: Nurse Practitioner

## 2021-02-27 VITALS — BP 130/82 | HR 89 | Temp 97.5°F | Wt 255.0 lb

## 2021-02-27 DIAGNOSIS — I10 Essential (primary) hypertension: Secondary | ICD-10-CM

## 2021-02-27 DIAGNOSIS — J300 Vasomotor rhinitis: Secondary | ICD-10-CM

## 2021-02-27 MED ORDER — LISINOPRIL 5 MG PO TABS: 5.0000 mg | ORAL_TABLET | Freq: Every day | ORAL | 1 refills | Status: DC

## 2021-02-27 MED ORDER — PHENTERMINE HCL 37.5 MG PO TABS: 37.5000 mg | ORAL_TABLET | Freq: Every day | ORAL | 0 refills | Status: DC

## 2021-02-27 NOTE — Progress Notes (Signed)
Subjective:    Patient ID: Raven Burch, female    DOB: March 08, 1981, 40 y.o.   MRN: [id number]  HPI Pt here for follow up on blood pressure. No issues. Taking Lisinopril 5 mg daily Pt having ongoing cough. Going on since April. Did come in in May for ear infection/strep. Worse when laying down at night and if pt becomes hot. Producing clear mucus.  Also notices it first thing in the morning.  Some left ear pain.  No sore throat.  No sinus pressure.  Non-smoker, no vaping.  Has allergies this time of year, currently on Zyrtec.  No no acid reflux or abdominal pain. Patient denies chest pain/ischemic type pain or SOB. Denies visual changes, difficulty speaking or swallowing or numbness or weakness of the face, arms or legs.  Depression screen [facility] 2/9 02/27/2021 11/28/2020 11/28/2020 11/23/2019 08/17/2018  Decreased Interest 0 0 0 0 0  Down, Depressed, Hopeless 0 0 0 0 0  PHQ - 2 Score 0 0 0 0 0  Altered sleeping - 0 - - -  Tired, decreased energy - 0 - - -  Change in appetite - 2 - - -  Feeling bad or failure about yourself  - 0 - - -  Trouble concentrating - 0 - - -  Moving slowly or fidgety/restless - 0 - - -  PHQ-9 Score - 2 - - -        Objective:   Physical Exam NAD.  Alert, oriented.  Calm cheerful affect.  TMs retracted bilaterally, no erythema.  Pharynx clear.  Neck supple with minimal anterior adenopathy.  Lungs clear.  Heart regular rate rhythm.  Abdomen soft nondistended nontender.  No coughing noted during visit. Today's Vitals   02/27/21 0926  BP: 130/82  Pulse: 89  Temp: (!) 97.5 F (36.4 C)  SpO2: 99%  Weight: 255 lb (115.7 kg)   Body mass index is 42.43 kg/m.        Assessment & Plan:   Problem List Items Addressed This Visit      Cardiovascular and Mediastinum   Essential hypertension - Primary   Relevant Medications   lisinopril (ZESTRIL) 5 MG tablet     Other   Morbid obesity (HCC)   Relevant Medications   phentermine (ADIPEX-P) 37.5 MG tablet     Other Visit Diagnoses    Vasomotor rhinitis         Meds ordered this encounter  Medications  . lisinopril (ZESTRIL) 5 MG tablet    Sig: Take 1 tablet (5 mg total) by mouth daily. For blood pressure    Dispense:  90 tablet    Refill:  1    Order Specific Question:   Supervising Provider    Answer:   Lilyan Punt A [[id number]]  . phentermine (ADIPEX-P) 37.5 MG tablet    Sig: Take 1 tablet (37.5 mg total) by mouth daily before breakfast.    Dispense:  30 tablet    Refill:  0    Order Specific Question:   Supervising Provider    Answer:   Lilyan Punt A [[id number]]   Continue lisinopril as directed.  Patient wishes to try phentermine to help her weight loss see previous note.  Patient to check BP and weight and contact office in 1 month. Recommend OTC steroid nasal spray for rhinitis. Encouraged healthy diet and regular activity.  Patient has not been going to the gym due to the persistent cough.  Plans to restart this is soon as  possible. Return in about 3 months (around 05/30/2021).

## 2021-04-01 ENCOUNTER — Encounter: Payer: Self-pay | Admitting: Nurse Practitioner

## 2021-04-09 ENCOUNTER — Ambulatory Visit (INDEPENDENT_AMBULATORY_CARE_PROVIDER_SITE_OTHER): Payer: BC Managed Care – PPO | Admitting: Family Medicine

## 2021-04-09 ENCOUNTER — Encounter: Payer: Self-pay | Admitting: Family Medicine

## 2021-04-09 ENCOUNTER — Other Ambulatory Visit: Payer: Self-pay

## 2021-04-09 VITALS — BP 131/85 | HR 90 | Temp 98.8°F | Wt 247.0 lb

## 2021-04-09 DIAGNOSIS — S39012A Strain of muscle, fascia and tendon of lower back, initial encounter: Secondary | ICD-10-CM | POA: Diagnosis not present

## 2021-04-09 MED ORDER — DICLOFENAC SODIUM 75 MG PO TBEC: 75.0000 mg | DELAYED_RELEASE_TABLET | Freq: Two times a day (BID) | ORAL | 0 refills | Status: DC

## 2021-04-09 NOTE — Progress Notes (Signed)
   Subjective:    Patient ID: Raven Burch, female    DOB: 08-18-1981, 40 y.o.   MRN: [id number]  HPI Mid Low back pain. Pain started 4 days ago after bending  over to pick up something and heard a pop in her back. Tried ibuprofen, heat, biofreeze spray.  Started Sunday Pop and pull Severe earlier in the week Tuesday a little better No hx Lower back  Not into the legs Heat and biofreeze and ibuprofen Works in [employer], [employer] Has not missed work Typically does not have thius  Review of Systems     Objective:   Physical Exam  General-in no acute distress Eyes-no discharge Lungs-respiratory rate normal, CTA CV-no murmurs,RRR Extremities skin warm dry no edema Neuro grossly normal Behavior normal, alert Lumbar pain to palpation Negative straight leg raise strength is good reflexes good     Assessment & Plan:  Lumbar strain exercises shown printout given anti-inflammatory twice daily for 7 to 14 days work excuse given for Thursday Friday work restriction for next week if ongoing troubles beyond next week recommend physical therapy If not completely well within 4 to 6 weeks recommend follow-up No need for x-rays or MRI Patient to call if any problems with medication Patient to call if she feels she needs muscle relaxer Patient to notify us if any setbacks

## 2021-04-10 ENCOUNTER — Other Ambulatory Visit: Payer: Self-pay | Admitting: Nurse Practitioner

## 2021-04-10 MED ORDER — PHENTERMINE HCL 37.5 MG PO TABS: 37.5000 mg | ORAL_TABLET | Freq: Every day | ORAL | 0 refills | Status: DC

## 2021-06-05 ENCOUNTER — Ambulatory Visit (INDEPENDENT_AMBULATORY_CARE_PROVIDER_SITE_OTHER): Payer: BC Managed Care – PPO | Admitting: Nurse Practitioner

## 2021-06-05 ENCOUNTER — Other Ambulatory Visit: Payer: Self-pay

## 2021-06-05 ENCOUNTER — Encounter: Payer: Self-pay | Admitting: Nurse Practitioner

## 2021-06-05 VITALS — BP 132/84 | HR 71 | Ht 65.0 in | Wt 244.2 lb

## 2021-06-05 DIAGNOSIS — Z23 Encounter for immunization: Secondary | ICD-10-CM | POA: Diagnosis not present

## 2021-06-05 DIAGNOSIS — I10 Essential (primary) hypertension: Secondary | ICD-10-CM

## 2021-06-05 DIAGNOSIS — J300 Vasomotor rhinitis: Secondary | ICD-10-CM | POA: Diagnosis not present

## 2021-06-05 MED ORDER — PHENTERMINE HCL 37.5 MG PO TABS: 37.5000 mg | ORAL_TABLET | Freq: Every day | ORAL | 2 refills | Status: DC

## 2021-06-05 MED ORDER — LISINOPRIL 5 MG PO TABS: 5.0000 mg | ORAL_TABLET | Freq: Every day | ORAL | 1 refills | Status: DC

## 2021-06-05 NOTE — Progress Notes (Signed)
   Subjective:    Patient ID: Raven Burch, female    DOB: Oct 13, 1980, 40 y.o.   MRN: [id number]  HPI  Patient arrives for a follow up on blood pressure after starting diet medication. Patient states she does have a nagging cough and she is not sure if it is allergies or blood pressure medication.    Patient states she was on vacation last week and did not do as well with her diet.  Otherwise tries to eat a healthy diet.  Has a very active job working with toddlers.  Has been going to the gym twice a week.  BP outside the office 130/80.  Has had brief spells of a nonproductive cough at times mainly when she gets overheated.  Off-and-on mild head congestion.  No fever ear pain or sore throat.                  Review of Systems No chest pain/ischemic type pain or shortness of breath.  No edema.  Denies any adverse effects while taking phentermine.    Objective:   Physical Exam NAD.  Alert, oriented.  TMs retracted bilaterally, no erythema.  Nasal mucosa minimally boggy.  Pharynx clear and moist.  Neck supple with mild soft anterior adenopathy.  Lungs clear.  Heart regular rate rhythm.  Today's Vitals   06/05/21 1409  BP: 132/84  Pulse: 71  Weight: 244 lb 3.2 oz (110.8 kg)  Height: 5\' 5"  (1.651 m)   Body mass index is 40.64 kg/m. Has lost 11 pounds since June.       Assessment & Plan:   Problem List Items Addressed This Visit       Cardiovascular and Mediastinum   Essential hypertension - Primary   Relevant Medications   lisinopril (ZESTRIL) 5 MG tablet     Other   Morbid obesity (HCC)   Relevant Medications   phentermine (ADIPEX-P) 37.5 MG tablet   Other Visit Diagnoses     Vasomotor rhinitis       Need for vaccination       Relevant Orders   Flu Vaccine QUAD 52mo+IM (Fluarix, Fluzone & Alfiuria Quad PF) (Completed)      Meds ordered this encounter  Medications   phentermine (ADIPEX-P) 37.5 MG tablet    Sig: Take 1 tablet (37.5 mg total) by mouth daily before  breakfast.    Dispense:  30 tablet    Refill:  2    Order Specific Question:   Supervising Provider    Answer:   5mo A [[id number]]   lisinopril (ZESTRIL) 5 MG tablet    Sig: Take 1 tablet (5 mg total) by mouth daily. For blood pressure    Dispense:  90 tablet    Refill:  1    Order Specific Question:   Supervising Provider    Answer:   Lilyan Punt A [[id number]]   Continue phentermine for 3 more months then discontinue.  Patient to contact office if she wishes to discuss other options for weight loss. Continue lisinopril as directed. Flu vaccine today. Continue healthy diet, activity and weight loss efforts. Return in about 6 months (around 12/03/2021). Call back sooner if needed.

## 2021-06-06 ENCOUNTER — Encounter: Payer: Self-pay | Admitting: Nurse Practitioner

## 2021-06-22 ENCOUNTER — Encounter: Payer: Self-pay | Admitting: Family Medicine

## 2021-08-11 DIAGNOSIS — Z20822 Contact with and (suspected) exposure to covid-19: Secondary | ICD-10-CM | POA: Diagnosis not present

## 2021-08-11 DIAGNOSIS — M791 Myalgia, unspecified site: Secondary | ICD-10-CM | POA: Diagnosis not present

## 2021-08-23 ENCOUNTER — Encounter: Payer: Self-pay | Admitting: Family Medicine

## 2021-08-24 NOTE — Telephone Encounter (Signed)
Nurses-I agree I recommend OV this week with me or Teresa Coombs NP

## 2021-10-07 ENCOUNTER — Telehealth: Payer: Self-pay | Admitting: Family Medicine

## 2021-10-07 DIAGNOSIS — I10 Essential (primary) hypertension: Secondary | ICD-10-CM

## 2021-10-07 DIAGNOSIS — R7301 Impaired fasting glucose: Secondary | ICD-10-CM

## 2021-10-07 DIAGNOSIS — E785 Hyperlipidemia, unspecified: Secondary | ICD-10-CM

## 2021-10-07 DIAGNOSIS — R5383 Other fatigue: Secondary | ICD-10-CM

## 2021-10-07 NOTE — Telephone Encounter (Signed)
Patient  has physical on 3/10 and needing labs

## 2021-10-07 NOTE — Telephone Encounter (Signed)
Last labs 12/01/20: Hep C, TSH, Lipid, CMP, CBC

## 2021-10-14 NOTE — Telephone Encounter (Signed)
Patient notified

## 2021-10-14 NOTE — Telephone Encounter (Signed)
Blood work ordered in Epic. Left message to return call to notify patient. 

## 2021-10-14 NOTE — Telephone Encounter (Signed)
Sherie Don C, NP    CBC, CMP, Lipid and A1C (elevated fasting glucose). Thanks.

## 2021-11-24 DIAGNOSIS — I10 Essential (primary) hypertension: Secondary | ICD-10-CM | POA: Diagnosis not present

## 2021-11-24 DIAGNOSIS — E785 Hyperlipidemia, unspecified: Secondary | ICD-10-CM | POA: Diagnosis not present

## 2021-11-24 DIAGNOSIS — R7301 Impaired fasting glucose: Secondary | ICD-10-CM | POA: Diagnosis not present

## 2021-11-24 DIAGNOSIS — R5383 Other fatigue: Secondary | ICD-10-CM | POA: Diagnosis not present

## 2021-11-25 LAB — LIPID PANEL
Chol/HDL Ratio: 4.4 ratio (ref 0.0–4.4)
Cholesterol, Total: 154 mg/dL (ref 100–199)
HDL: 35 mg/dL — ABNORMAL LOW (ref >39)
LDL Chol Calc (NIH): 102 mg/dL — ABNORMAL HIGH (ref 0–99)
Triglycerides: 91 mg/dL (ref 0–149)
VLDL Cholesterol Cal: 17 mg/dL (ref 5–40)

## 2021-11-25 LAB — CBC WITH DIFFERENTIAL/PLATELET
Basophils Absolute: 0 10*3/uL (ref 0.0–0.2)
Basos: 0 %
EOS (ABSOLUTE): 0.1 10*3/uL (ref 0.0–0.4)
Eos: 1 %
Hematocrit: 41.2 % (ref 34.0–46.6)
Hemoglobin: 14 g/dL (ref 11.1–15.9)
Immature Grans (Abs): 0 10*3/uL (ref 0.0–0.1)
Immature Granulocytes: 0 %
Lymphocytes Absolute: 1.8 10*3/uL (ref 0.7–3.1)
Lymphs: 21 %
MCH: 30.7 pg (ref 26.6–33.0)
MCHC: 34 g/dL (ref 31.5–35.7)
MCV: 90 fL (ref 79–97)
Monocytes Absolute: 0.4 10*3/uL (ref 0.1–0.9)
Monocytes: 4 %
Neutrophils Absolute: 6.1 10*3/uL (ref 1.4–7.0)
Neutrophils: 74 %
Platelets: 356 10*3/uL (ref 150–450)
RBC: 4.56 x10E6/uL (ref 3.77–5.28)
RDW: 12.1 % (ref 11.7–15.4)
WBC: 8.4 10*3/uL (ref 3.4–10.8)

## 2021-11-25 LAB — COMPREHENSIVE METABOLIC PANEL
ALT: 16 IU/L (ref 0–32)
AST: 15 IU/L (ref 0–40)
Albumin/Globulin Ratio: 1.8 (ref 1.2–2.2)
Albumin: 4.5 g/dL (ref 3.8–4.8)
Alkaline Phosphatase: 106 IU/L (ref 44–121)
BUN/Creatinine Ratio: 12 (ref 9–23)
BUN: 10 mg/dL (ref 6–24)
Bilirubin Total: 0.5 mg/dL (ref 0.0–1.2)
CO2: 20 mmol/L (ref 20–29)
Calcium: 9.7 mg/dL (ref 8.7–10.2)
Chloride: 108 mmol/L — ABNORMAL HIGH (ref 96–106)
Creatinine, Ser: 0.81 mg/dL (ref 0.57–1.00)
Globulin, Total: 2.5 g/dL (ref 1.5–4.5)
Glucose: 95 mg/dL (ref 70–99)
Potassium: 4.3 mmol/L (ref 3.5–5.2)
Sodium: 139 mmol/L (ref 134–144)
Total Protein: 7 g/dL (ref 6.0–8.5)
eGFR: 93 mL/min/{1.73_m2} (ref >59)

## 2021-11-25 LAB — HEMOGLOBIN A1C
Est. average glucose Bld gHb Est-mCnc: 111 mg/dL
Hgb A1c MFr Bld: 5.5 % (ref 4.8–5.6)

## 2021-12-04 ENCOUNTER — Ambulatory Visit (INDEPENDENT_AMBULATORY_CARE_PROVIDER_SITE_OTHER): Payer: BC Managed Care – PPO | Admitting: Nurse Practitioner

## 2021-12-04 ENCOUNTER — Other Ambulatory Visit: Payer: Self-pay

## 2021-12-04 ENCOUNTER — Encounter: Payer: Self-pay | Admitting: Nurse Practitioner

## 2021-12-04 VITALS — BP 135/86 | HR 88 | Temp 97.2°F | Ht 65.0 in | Wt 238.0 lb

## 2021-12-04 DIAGNOSIS — Z1231 Encounter for screening mammogram for malignant neoplasm of breast: Secondary | ICD-10-CM | POA: Diagnosis not present

## 2021-12-04 DIAGNOSIS — I10 Essential (primary) hypertension: Secondary | ICD-10-CM | POA: Diagnosis not present

## 2021-12-04 DIAGNOSIS — S60511A Abrasion of right hand, initial encounter: Secondary | ICD-10-CM

## 2021-12-04 DIAGNOSIS — Z23 Encounter for immunization: Secondary | ICD-10-CM | POA: Diagnosis not present

## 2021-12-04 DIAGNOSIS — Z01419 Encounter for gynecological examination (general) (routine) without abnormal findings: Secondary | ICD-10-CM | POA: Diagnosis not present

## 2021-12-04 DIAGNOSIS — W5503XA Scratched by cat, initial encounter: Secondary | ICD-10-CM | POA: Diagnosis not present

## 2021-12-04 MED ORDER — LISINOPRIL 5 MG PO TABS: 5.0000 mg | ORAL_TABLET | Freq: Every day | ORAL | 1 refills | Status: DC

## 2021-12-04 NOTE — Progress Notes (Signed)
? ?  Subjective:  ? ? Patient ID: Raven Burch, female    DOB: 1981/03/18, 41 y.o.   MRN: [id number] ? ?HPI ? ?The patient comes in today for a wellness visit. ? ? ? ?A review of their health history was completed. ? A review of medications was also completed. ? ?Any needed refills;  ? ?Eating habits: good  ? ?Falls/  MVA accidents in past few months: none  ? ?Regular exercise: gym x 2 week x 1 hr ? ?Specialist pt sees on regular basis: none ? ?Preventative health issues were discussed.  ? ?Additional concerns:   ? ?Review of Systems ? ?   ?Objective:  ? Physical Exam ? ? ?The 10-year ASCVD risk score (Arnett DK, et al., 2019) is: 1.3% ?  Values used to calculate the score: ?    Age: 41 years ?    Sex: Female ?    Is Non-Hispanic African American: No ?    Diabetic: No ?    Tobacco smoker: No ?    Systolic Blood Pressure: [id number] mmHg ?    Is BP treated: Yes ?    HDL Cholesterol: 35 mg/dL ?    Total Cholesterol: 154 mg/dL ? ? ?   ?Assessment & Plan:  ? ? ?

## 2021-12-04 NOTE — Progress Notes (Signed)
? ?Subjective:  ? ? Patient ID: Raven Burch, female    DOB: [birth year], 41 y.o.   MRN: [id number] ? ?HPI ?Patient presents today for a wellness visit. Reports healthy dieting and 1 hour daily at the gym for exercise. Up to date on vaccination expect tdap, had multiple cat scratches on right arm and breast. No infection or fever. Routine dental and eye exams completed this year. She denies tobacco use, illicit drug use and alcohol use. Same female sexual partner, LMP 12/01/21, regular cycles, normal flow and no concerns at this time.  ? ? ?Review of Systems  ?Constitutional:  Negative for activity change, appetite change and fatigue.  ?HENT:  Negative for sore throat and trouble swallowing.   ?Respiratory:  Negative for cough, chest tightness, shortness of breath and wheezing.   ?Cardiovascular:  Negative for chest pain.  ?Gastrointestinal:  Negative for abdominal distention, abdominal pain, constipation, diarrhea, nausea and vomiting.  ?Genitourinary:  Negative for difficulty urinating, dysuria, enuresis, frequency, genital sores, menstrual problem, pelvic pain, urgency and vaginal discharge.  ?Depression screen [facility] 2/9 04/09/2021  ?Decreased Interest 0  ?Down, Depressed, Hopeless 0  ?PHQ - 2 Score 0  ?Altered sleeping -  ?Tired, decreased energy -  ?Change in appetite -  ?Feeling bad or failure about yourself  -  ?Trouble concentrating -  ?Moving slowly or fidgety/restless -  ?PHQ-9 Score -  ? ? ?   ?Objective:  ? Physical Exam ?Constitutional:   ?   General: She is not in acute distress. ?   Appearance: She is well-developed.  ?Neck:  ?   Thyroid: No thyromegaly.  ?   Trachea: No tracheal deviation.  ?   Comments: Thyroid non tender to palpation. No mass or goiter noted.  ?Cardiovascular:  ?   Rate and Rhythm: Normal rate and regular rhythm.  ?   Heart sounds: Normal heart sounds. No murmur heard. ?Pulmonary:  ?   Effort: Pulmonary effort is normal.  ?   Breath sounds: Normal breath sounds.  ?Chest:  ?Breasts: ?    Right: No swelling, inverted nipple, mass, skin change or tenderness.  ?   Left: No swelling, inverted nipple, mass, skin change or tenderness.  ?Abdominal:  ?   General: There is no distension.  ?   Palpations: Abdomen is soft.  ?   Tenderness: There is no abdominal tenderness.  ?Genitourinary: ?   Comments:  Defers pelvic exam. Denies any problems.  ?Musculoskeletal:  ?   Cervical back: Normal range of motion and neck supple.  ?Lymphadenopathy:  ?   Cervical: No cervical adenopathy.  ?   Upper Body:  ?   Right upper body: No supraclavicular, axillary or pectoral adenopathy.  ?   Left upper body: No supraclavicular, axillary or pectoral adenopathy.  ?Skin: ?   General: Skin is warm and dry.  ?   Comments: Several superficial healing cat scratches on right arm and breast. No signs of infection.   ?Neurological:  ?   Mental Status: She is alert and oriented to person, place, and time.  ?Psychiatric:     ?   Mood and Affect: Mood normal.     ?   Behavior: Behavior normal.     ?   Thought Content: Thought content normal.     ?   Judgment: Judgment normal.  ? ?Today's Vitals  ? 12/04/21 1357  ?BP: 135/86  ?Pulse: 88  ?Temp: (!) 97.2 ?F (36.2 ?C)  ?SpO2: 100%  ?Weight: 238 lb (  108 kg)  ?Height: 5' 5"  (1.651 m)  ? ?Body mass index is 39.61 kg/m?.  ? ?Recent Results (from the past 2160 hour(s))  ?Hemoglobin A1c     Status: None  ? Collection Time: 11/24/21  9:15 AM  ?Result Value Ref Range  ? Hgb A1c MFr Bld 5.5 4.8 - 5.6 %  ?  Comment:          Prediabetes: 5.7 - 6.4 ?         Diabetes: >6.4 ?         Glycemic control for adults with diabetes: <7.0 ?  ? Est. average glucose Bld gHb Est-mCnc 111 mg/dL  ?Comprehensive metabolic panel     Status: Abnormal  ? Collection Time: 11/24/21  9:15 AM  ?Result Value Ref Range  ? Glucose 95 70 - 99 mg/dL  ? BUN 10 6 - 24 mg/dL  ? Creatinine, Ser 0.81 0.57 - 1.00 mg/dL  ? eGFR 93 >59 mL/min/1.73  ? BUN/Creatinine Ratio 12 9 - 23  ? Sodium 139 134 - 144 mmol/L  ? Potassium 4.3 3.5 - 5.2  mmol/L  ? Chloride 108 (H) 96 - 106 mmol/L  ? CO2 20 20 - 29 mmol/L  ? Calcium 9.7 8.7 - 10.2 mg/dL  ? Total Protein 7.0 6.0 - 8.5 g/dL  ? Albumin 4.5 3.8 - 4.8 g/dL  ? Globulin, Total 2.5 1.5 - 4.5 g/dL  ? Albumin/Globulin Ratio 1.8 1.2 - 2.2  ? Bilirubin Total 0.5 0.0 - 1.2 mg/dL  ? Alkaline Phosphatase 106 44 - 121 IU/L  ? AST 15 0 - 40 IU/L  ? ALT 16 0 - 32 IU/L  ?Lipid panel     Status: Abnormal  ? Collection Time: 11/24/21  9:15 AM  ?Result Value Ref Range  ? Cholesterol, Total 154 100 - 199 mg/dL  ? Triglycerides 91 0 - 149 mg/dL  ? HDL 35 (L) >39 mg/dL  ? VLDL Cholesterol Cal 17 5 - 40 mg/dL  ? LDL Chol Calc (NIH) 102 (H) 0 - 99 mg/dL  ? Chol/HDL Ratio 4.4 0.0 - 4.4 ratio  ?  Comment:                                   T. Chol/HDL Ratio ?                                            Men  Women ?                              1/2 Avg.Risk  3.4    3.3 ?                                  Avg.Risk  5.0    4.4 ?                               2X Avg.Risk  9.6    7.1 ?                               3X  Avg.Risk 23.4   11.0 ?  ?CBC with Differential/Platelet     Status: None  ? Collection Time: 11/24/21  9:15 AM  ?Result Value Ref Range  ? WBC 8.4 3.4 - 10.8 x10E3/uL  ? RBC 4.56 3.77 - 5.28 x10E6/uL  ? Hemoglobin 14.0 11.1 - 15.9 g/dL  ? Hematocrit 41.2 34.0 - 46.6 %  ? MCV 90 79 - 97 fL  ? MCH 30.7 26.6 - 33.0 pg  ? MCHC 34.0 31.5 - 35.7 g/dL  ? RDW 12.1 11.7 - 15.4 %  ? Platelets 356 150 - 450 x10E3/uL  ? Neutrophils 74 Not Estab. %  ? Lymphs 21 Not Estab. %  ? Monocytes 4 Not Estab. %  ? Eos 1 Not Estab. %  ? Basos 0 Not Estab. %  ? Neutrophils Absolute 6.1 1.4 - 7.0 x10E3/uL  ? Lymphocytes Absolute 1.8 0.7 - 3.1 x10E3/uL  ? Monocytes Absolute 0.4 0.1 - 0.9 x10E3/uL  ? EOS (ABSOLUTE) 0.1 0.0 - 0.4 x10E3/uL  ? Basophils Absolute 0.0 0.0 - 0.2 x10E3/uL  ? Immature Granulocytes 0 Not Estab. %  ? Immature Grans (Abs) 0.0 0.0 - 0.1 x10E3/uL  ?Labs reviewed with patient.  ?The 10-year ASCVD risk score (Arnett DK, et al.,  2019) is: 1.3% ?  Values used to calculate the score: ?    Age: 39 years ?    Sex: Female ?    Is Non-Hispanic African American: No ?    Diabetic: No ?    Tobacco smoker: No ?    Systolic Blood Pressure: 277 mmHg ?    Is BP treated: Yes ?    HDL Cholesterol: 35 mg/dL ?    Total Cholesterol: 154 mg/dL ?   ?Assessment & Plan:  ? ?Problem List Items Addressed This Visit   ?None ?Visit Diagnoses   ? ? Well woman exam    -  Primary  ? Cat scratch of right hand, initial encounter      ? Relevant Orders  ? Tdap vaccine greater than or equal to 7yo IM (Completed)  ? Screening mammogram for breast cancer      ? Relevant Orders  ? MM DIGITAL SCREENING BILATERAL  ? ?  ? ?Meds ordered this encounter  ?Medications  ? lisinopril (ZESTRIL) 5 MG tablet  ?  Sig: Take 1 tablet (5 mg total) by mouth daily. For blood pressure  ?  Dispense:  90 tablet  ?  Refill:  1  ? ? ?Pap Smear completed.  ?Education provided on weight loss, healthy diet and exercise plan. Continue healthy habits. ?Will discuss alternative weight loss options during next visit if current plan is not working. Defers medication at this time.  ?Tetanus vaccine given today. Call back if wounds become infected.  ?Mammogram  scheduled.  ?Return in about 6 months (around 06/06/2022). ? ? ?

## 2021-12-05 ENCOUNTER — Encounter: Payer: Self-pay | Admitting: Nurse Practitioner

## 2021-12-24 ENCOUNTER — Ambulatory Visit (HOSPITAL_COMMUNITY)
Admission: RE | Admit: 2021-12-24 | Discharge: 2021-12-24 | Disposition: A | Discharge status: Home / Self Care | Payer: BC Managed Care – PPO | Source: Ambulatory Visit | Attending: Nurse Practitioner | Admitting: Nurse Practitioner

## 2021-12-24 DIAGNOSIS — Z1231 Encounter for screening mammogram for malignant neoplasm of breast: Secondary | ICD-10-CM | POA: Insufficient documentation

## 2022-08-06 ENCOUNTER — Other Ambulatory Visit: Payer: Self-pay | Admitting: Nurse Practitioner

## 2022-08-11 IMAGING — MG MM DIGITAL SCREENING BILAT W/ TOMO AND CAD
8 series · 8 of 24 positions shown · non-contrast
Comparison: None.

ACR Breast Density Category a: The breast tissue is almost entirely
fatty.

CLINICAL DATA: Screening.

EXAM:
DIGITAL SCREENING BILATERAL MAMMOGRAM WITH TOMOSYNTHESIS AND CAD
TECHNIQUE: Bilateral screening digital craniocaudal and mediolateral oblique
mammograms were obtained. Bilateral screening digital breast
tomosynthesis was performed. The images were evaluated with
computer-aided detection.

[L CC synth-2D]
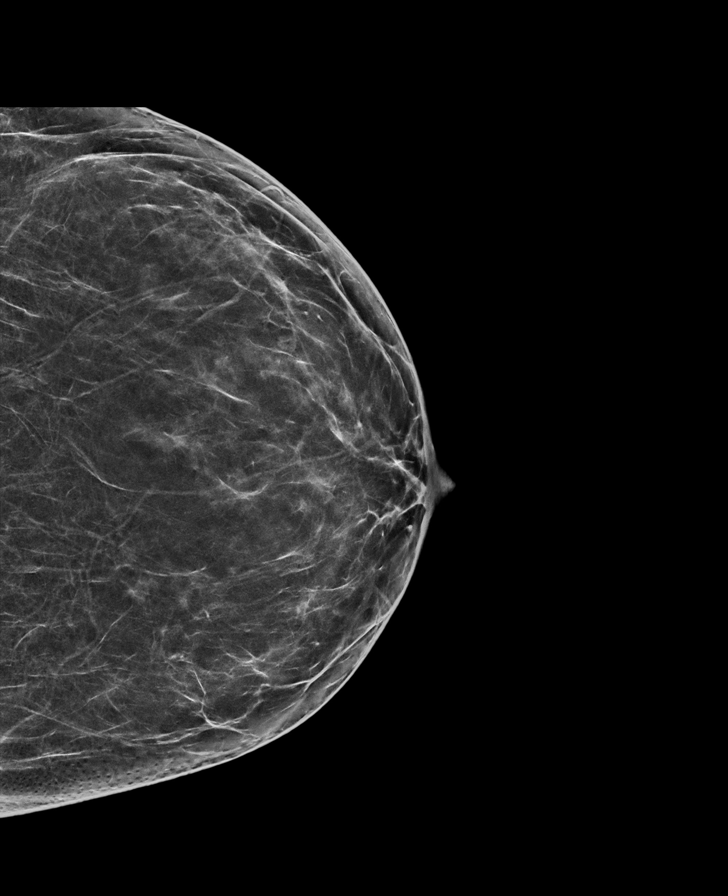

[R CC synth-2D]
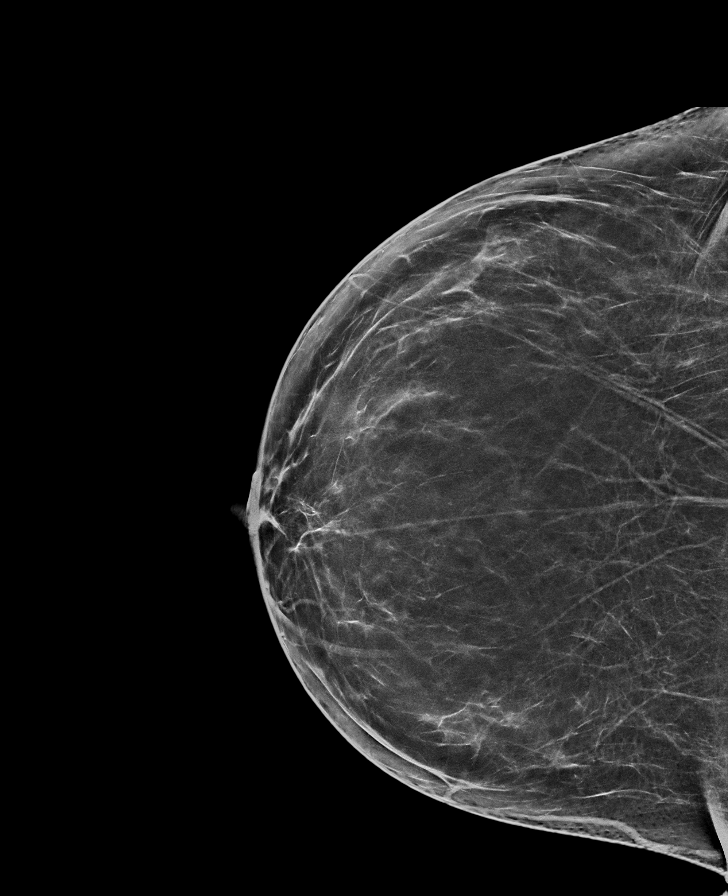

[R MLO synth-2D]
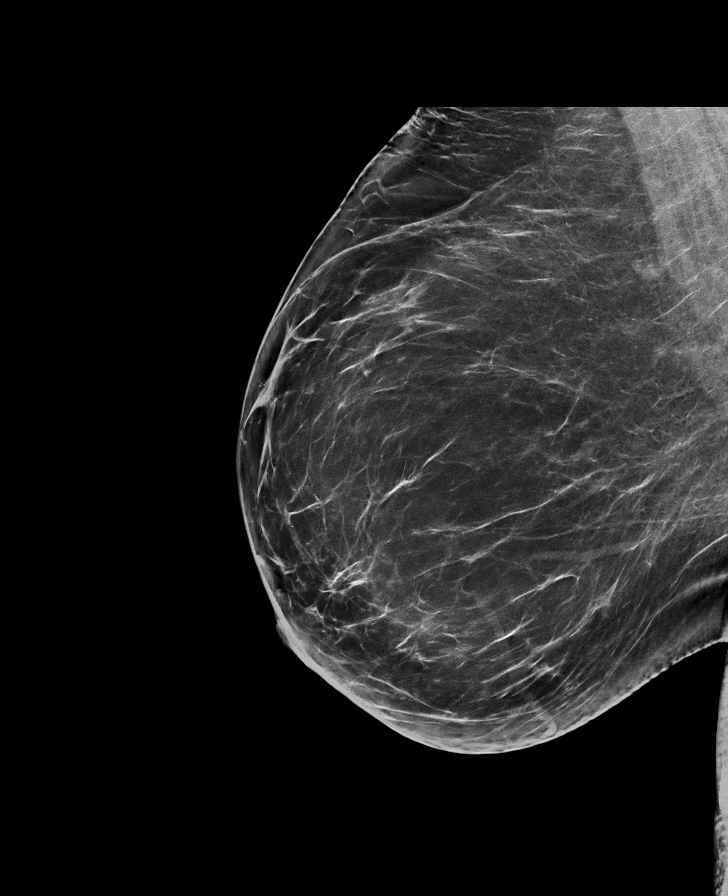

[L MLO synth-2D]
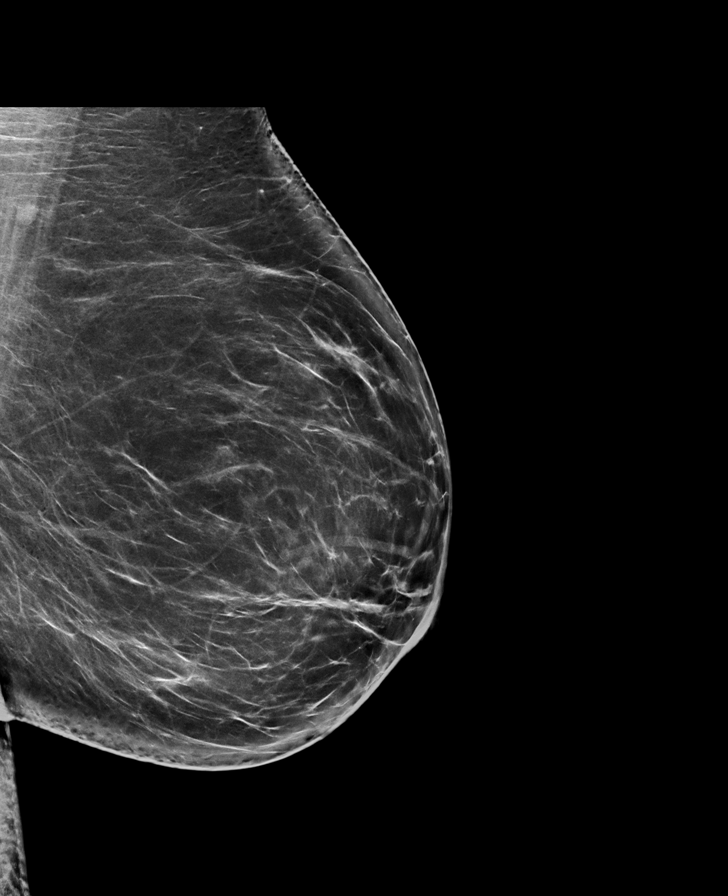

[R CC tomo · tomo slice 37/74.0]
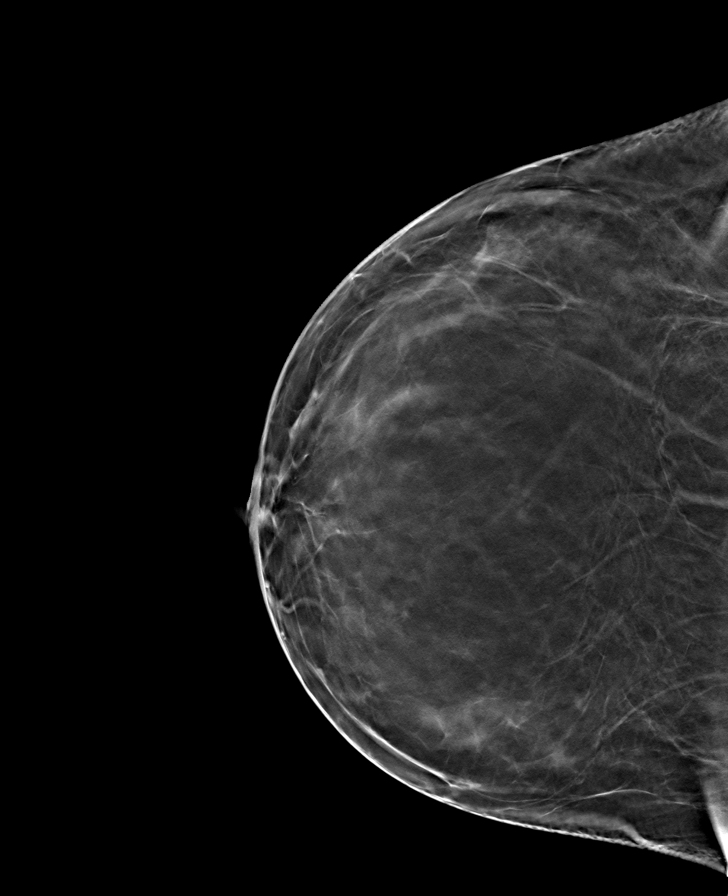

[L CC tomo · tomo slice 35/68.0]
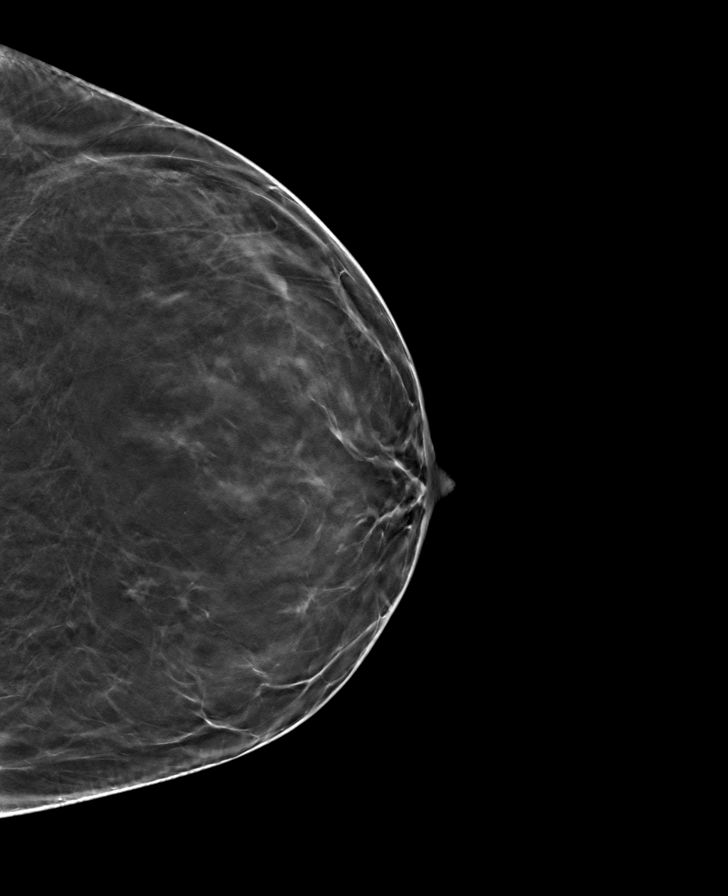

[R MLO tomo · tomo slice 45/89.0]
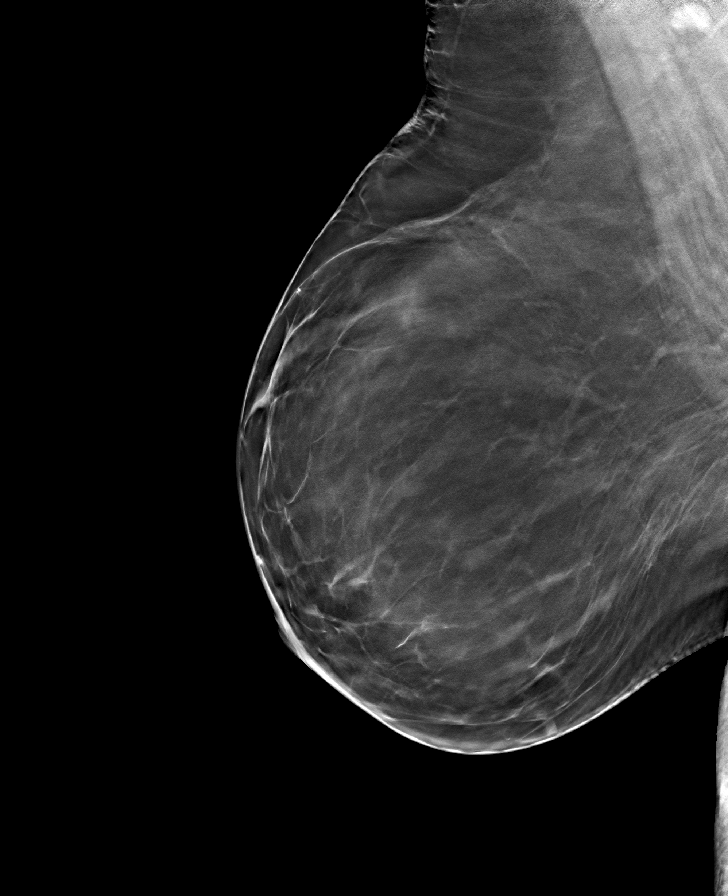

[L MLO tomo · tomo slice 43/86.0]
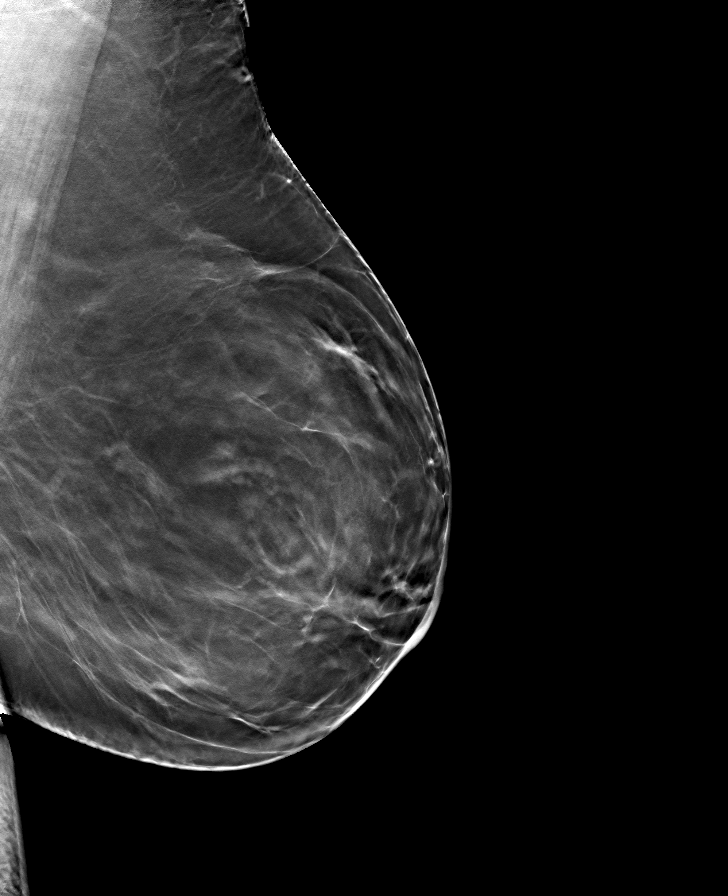

[8 of 24 positions shown; findings below may reference images not displayed]

FINDINGS: There are no findings suspicious for malignancy.
IMPRESSION: No mammographic evidence of malignancy. A result letter of this
screening mammogram will be mailed directly to the patient.

RECOMMENDATION:
Screening mammogram in one year. (Code:44-M-M6Q)

BI-RADS CATEGORY  1: Negative.

## 2022-08-26 DIAGNOSIS — J0140 Acute pansinusitis, unspecified: Secondary | ICD-10-CM | POA: Diagnosis not present

## 2022-08-26 DIAGNOSIS — R059 Cough, unspecified: Secondary | ICD-10-CM | POA: Diagnosis not present

## 2022-08-26 DIAGNOSIS — Z20822 Contact with and (suspected) exposure to covid-19: Secondary | ICD-10-CM | POA: Diagnosis not present

## 2022-08-26 DIAGNOSIS — R070 Pain in throat: Secondary | ICD-10-CM | POA: Diagnosis not present

## 2022-10-25 DIAGNOSIS — Z20822 Contact with and (suspected) exposure to covid-19: Secondary | ICD-10-CM | POA: Diagnosis not present

## 2022-10-25 DIAGNOSIS — J111 Influenza due to unidentified influenza virus with other respiratory manifestations: Secondary | ICD-10-CM | POA: Diagnosis not present

## 2022-10-25 DIAGNOSIS — R059 Cough, unspecified: Secondary | ICD-10-CM | POA: Diagnosis not present

## 2022-10-25 DIAGNOSIS — H6691 Otitis media, unspecified, right ear: Secondary | ICD-10-CM | POA: Diagnosis not present

## 2022-11-04 ENCOUNTER — Telehealth: Payer: Self-pay | Admitting: Family Medicine

## 2022-11-04 DIAGNOSIS — E785 Hyperlipidemia, unspecified: Secondary | ICD-10-CM

## 2022-11-04 DIAGNOSIS — I10 Essential (primary) hypertension: Secondary | ICD-10-CM

## 2022-11-04 DIAGNOSIS — R7301 Impaired fasting glucose: Secondary | ICD-10-CM

## 2022-11-04 NOTE — Telephone Encounter (Signed)
Patient has physical on 12/17/22 and needing labs done

## 2022-11-04 NOTE — Telephone Encounter (Signed)
Last labs completed 11/24/21-CBC, Lipid, CMP, A1C. Please advise. Thank you

## 2022-11-05 NOTE — Telephone Encounter (Signed)
Lab orders placed. Left message to return call  

## 2022-11-09 NOTE — Telephone Encounter (Signed)
Left message to return call.  

## 2022-11-15 NOTE — Telephone Encounter (Signed)
Unable to reach patient. Will close message per protocol

## 2022-12-14 LAB — CBC WITH DIFFERENTIAL/PLATELET
Basophils Absolute: 0 10*3/uL (ref 0.0–0.2)
Basos: 0 %
EOS (ABSOLUTE): 0.1 10*3/uL (ref 0.0–0.4)
Eos: 1 %
Hematocrit: 40.7 % (ref 34.0–46.6)
Hemoglobin: 13.9 g/dL (ref 11.1–15.9)
Immature Grans (Abs): 0 10*3/uL (ref 0.0–0.1)
Immature Granulocytes: 0 %
Lymphocytes Absolute: 2.8 10*3/uL (ref 0.7–3.1)
Lymphs: 25 %
MCH: 31.1 pg (ref 26.6–33.0)
MCHC: 34.2 g/dL (ref 31.5–35.7)
MCV: 91 fL (ref 79–97)
Monocytes Absolute: 0.5 10*3/uL (ref 0.1–0.9)
Monocytes: 5 %
Neutrophils Absolute: 7.5 10*3/uL — ABNORMAL HIGH (ref 1.4–7.0)
Neutrophils: 69 %
Platelets: 335 10*3/uL (ref 150–450)
RBC: 4.47 x10E6/uL (ref 3.77–5.28)
RDW: 12.9 % (ref 11.7–15.4)
WBC: 10.9 10*3/uL — ABNORMAL HIGH (ref 3.4–10.8)

## 2022-12-14 LAB — LIPID PANEL
Chol/HDL Ratio: 3.9 ratio (ref 0.0–4.4)
Cholesterol, Total: 154 mg/dL (ref 100–199)
HDL: 40 mg/dL (ref >39)
LDL Chol Calc (NIH): 100 mg/dL — ABNORMAL HIGH (ref 0–99)
Triglycerides: 70 mg/dL (ref 0–149)
VLDL Cholesterol Cal: 14 mg/dL (ref 5–40)

## 2022-12-14 LAB — HEMOGLOBIN A1C
Est. average glucose Bld gHb Est-mCnc: 108 mg/dL
Hgb A1c MFr Bld: 5.4 % (ref 4.8–5.6)

## 2022-12-14 LAB — COMPREHENSIVE METABOLIC PANEL
ALT: 19 IU/L (ref 0–32)
AST: 18 IU/L (ref 0–40)
Albumin/Globulin Ratio: 1.9 (ref 1.2–2.2)
Albumin: 4.7 g/dL (ref 3.9–4.9)
Alkaline Phosphatase: 92 IU/L (ref 44–121)
BUN/Creatinine Ratio: 10 (ref 9–23)
BUN: 8 mg/dL (ref 6–24)
Bilirubin Total: 0.6 mg/dL (ref 0.0–1.2)
CO2: 20 mmol/L (ref 20–29)
Calcium: 10 mg/dL (ref 8.7–10.2)
Chloride: 105 mmol/L (ref 96–106)
Creatinine, Ser: 0.78 mg/dL (ref 0.57–1.00)
Globulin, Total: 2.5 g/dL (ref 1.5–4.5)
Glucose: 77 mg/dL (ref 70–99)
Potassium: 4.3 mmol/L (ref 3.5–5.2)
Sodium: 141 mmol/L (ref 134–144)
Total Protein: 7.2 g/dL (ref 6.0–8.5)
eGFR: 97 mL/min/{1.73_m2} (ref >59)

## 2022-12-15 ENCOUNTER — Other Ambulatory Visit (HOSPITAL_COMMUNITY): Payer: Self-pay | Admitting: Family Medicine

## 2022-12-15 DIAGNOSIS — Z1231 Encounter for screening mammogram for malignant neoplasm of breast: Secondary | ICD-10-CM

## 2022-12-17 ENCOUNTER — Encounter: Payer: Self-pay | Admitting: Nurse Practitioner

## 2022-12-17 ENCOUNTER — Ambulatory Visit (INDEPENDENT_AMBULATORY_CARE_PROVIDER_SITE_OTHER): Payer: 59 | Admitting: Nurse Practitioner

## 2022-12-17 VITALS — BP 128/88 | HR 95 | Temp 97.5°F | Ht 65.0 in | Wt 224.0 lb

## 2022-12-17 DIAGNOSIS — Z01411 Encounter for gynecological examination (general) (routine) with abnormal findings: Secondary | ICD-10-CM

## 2022-12-17 DIAGNOSIS — Z01419 Encounter for gynecological examination (general) (routine) without abnormal findings: Secondary | ICD-10-CM

## 2022-12-17 MED ORDER — LISINOPRIL 5 MG PO TABS: ORAL_TABLET | ORAL | 1 refills | Status: DC

## 2022-12-17 NOTE — Patient Instructions (Signed)
Flonase

## 2022-12-17 NOTE — Progress Notes (Unsigned)
Subjective:    Patient ID: Raven Burch, female    DOB: 1980/11/05, 42 y.o.   MRN: [id number]  HPI Raven Burch is present today for her annual physical. She has a healthy diet, with a variety of fruits and vegetables. She lost 14 pounds since her last visit. 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 Her menstrual cycles are regular, lasting 3 days. She is on her cycle today. Denies abnormal vaginal discharge, irritation or pelvic pain. No complaints of anxiety or depression issues. Sleep is good. She gets regular eye and dental exams. Same female sexual partner. BTL for birth control. Mammogram is scheduled for the end of the month. Raven Burch does report increased pressure in her right ear.   Review of Systems  Constitutional:  Negative for activity change, appetite change, fatigue and fever.  HENT:  Negative for sore throat and trouble swallowing.   Respiratory:  Negative for cough, chest tightness, shortness of breath and wheezing.   Cardiovascular:  Negative for chest pain and palpitations.  Gastrointestinal:  Negative for abdominal distention, abdominal pain, constipation, diarrhea, nausea and vomiting.  Genitourinary:  Negative for difficulty urinating, dysuria, enuresis, frequency, genital sores, menstrual problem, pelvic pain, urgency, vaginal discharge and vaginal pain.  Neurological:  Negative for light-headedness and headaches.  Psychiatric/Behavioral:  Negative for sleep disturbance. The patient is not nervous/anxious.       Objective:   Physical Exam Vitals and nursing note reviewed. Exam conducted with a chaperone present.  Constitutional:      General: She is not in acute distress.    Appearance: She is well-developed and normal weight. She is not ill-appearing.  HENT:     Right Ear: Hearing, tympanic membrane, ear canal and external ear normal. No drainage or swelling. Tympanic membrane is not erythematous or retracted.     Left Ear: Hearing, ear  canal and external ear normal. No drainage or swelling. A middle ear effusion is present. Tympanic membrane is not erythematous.     Nose:     Comments: Pale, boggy left turbinate.  Neck:     Thyroid: No thyromegaly.     Trachea: No tracheal deviation.     Comments: Thyroid non tender to palpation. No mass or goiter noted.  Cardiovascular:     Rate and Rhythm: Normal rate and regular rhythm.     Heart sounds: Normal heart sounds. No murmur heard. Pulmonary:     Effort: Pulmonary effort is normal.     Breath sounds: Normal breath sounds.  Chest:  Breasts:    Right: Normal. No swelling, inverted nipple, mass, nipple discharge, skin change or tenderness.     Left: Normal. No swelling, inverted nipple, mass, nipple discharge, skin change or tenderness.  Abdominal:     General: Abdomen is flat. There is no distension.     Palpations: Abdomen is soft. There is no mass.     Tenderness: There is no abdominal tenderness.  Genitourinary:    Comments: Defers pelvic exam. On menses today.  Musculoskeletal:     Cervical back: Normal range of motion and neck supple.  Lymphadenopathy:     Cervical: No cervical adenopathy.     Upper Body:     Right upper body: No supraclavicular, axillary or pectoral adenopathy.     Left upper body: No supraclavicular, axillary or pectoral adenopathy.  Skin:    General: Skin is warm and dry.     Findings: No rash.  Neurological:  Mental Status: She is alert and oriented to person, place, and time.  Psychiatric:        Mood and Affect: Mood normal.        Behavior: Behavior normal.        Thought Content: Thought content normal.        Judgment: Judgment normal.    Vitals:   12/17/22 1400  BP: 128/88  Pulse: 95  Temp: (!) 97.5 F (36.4 C)  Height: 5\' 5"  (1.651 m)  Weight: 224 lb (101.6 kg)  SpO2: 99%  TempSrc: Oral  BMI (Calculated): 37.28    Results for orders placed or performed in visit on 11/04/22  CBC with Differential  Result Value Ref  Range   WBC 10.9 (H) 3.4 - 10.8 x10E3/uL   RBC 4.47 3.77 - 5.28 x10E6/uL   Hemoglobin 13.9 11.1 - 15.9 g/dL   Hematocrit 40.7 34.0 - 46.6 %   MCV 91 79 - 97 fL   MCH 31.1 26.6 - 33.0 pg   MCHC 34.2 31.5 - 35.7 g/dL   RDW 12.9 11.7 - 15.4 %   Platelets 335 150 - 450 x10E3/uL   Neutrophils 69 Not Estab. %   Lymphs 25 Not Estab. %   Monocytes 5 Not Estab. %   Eos 1 Not Estab. %   Basos 0 Not Estab. %   Neutrophils Absolute 7.5 (H) 1.4 - 7.0 x10E3/uL   Lymphocytes Absolute 2.8 0.7 - 3.1 x10E3/uL   Monocytes Absolute 0.5 0.1 - 0.9 x10E3/uL   EOS (ABSOLUTE) 0.1 0.0 - 0.4 x10E3/uL   Basophils Absolute 0.0 0.0 - 0.2 x10E3/uL   Immature Granulocytes 0 Not Estab. %   Immature Grans (Abs) 0.0 0.0 - 0.1 x10E3/uL  Lipid panel  Result Value Ref Range   Cholesterol, Total 154 100 - 199 mg/dL   Triglycerides 70 0 - 149 mg/dL   HDL 40 >39 mg/dL   VLDL Cholesterol Cal 14 5 - 40 mg/dL   LDL Chol Calc (NIH) 100 (H) 0 - 99 mg/dL   Chol/HDL Ratio 3.9 0.0 - 4.4 ratio  Hemoglobin A1c  Result Value Ref Range   Hgb A1c MFr Bld 5.4 4.8 - 5.6 %   Est. average glucose Bld gHb Est-mCnc 108 mg/dL  Comprehensive metabolic panel  Result Value Ref Range   Glucose 77 70 - 99 mg/dL   BUN 8 6 - 24 mg/dL   Creatinine, Ser 0.78 0.57 - 1.00 mg/dL   eGFR 97 >59 mL/min/1.73   BUN/Creatinine Ratio 10 9 - 23   Sodium 141 134 - 144 mmol/L   Potassium 4.3 3.5 - 5.2 mmol/L   Chloride 105 96 - 106 mmol/L   CO2 20 20 - 29 mmol/L   Calcium 10.0 8.7 - 10.2 mg/dL   Total Protein 7.2 6.0 - 8.5 g/dL   Albumin 4.7 3.9 - 4.9 g/dL   Globulin, Total 2.5 1.5 - 4.5 g/dL   Albumin/Globulin Ratio 1.9 1.2 - 2.2   Bilirubin Total 0.6 0.0 - 1.2 mg/dL   Alkaline Phosphatase 92 44 - 121 IU/L   AST 18 0 - 40 IU/L   ALT 19 0 - 32 IU/L         04/09/2021    8:30 AM 02/27/2021    9:26 AM 11/28/2020    4:20 PM 11/28/2020    3:14 PM 11/23/2019    1:30 PM  Depression screen PHQ 2/9  Decreased Interest 0 0 0 0 0  Down, Depressed,  Hopeless 0  0 0 0 0  PHQ - 2 Score 0 0 0 0 0  Altered sleeping   0    Tired, decreased energy   0    Change in appetite   2    Feeling bad or failure about yourself    0    Trouble concentrating   0    Moving slowly or fidgety/restless   0    PHQ-9 Score   2       Assessment & Plan:  1. Well woman exam -Educated patient on continuing to follow a healthy diet and exercise.  -Reviewed labs with patient.  Watch BP as she continues to lose weight. Reviewed signs of orthostatic hypotension. Reduce or stop Lisinopril and contact office if any problems.  Return in about 6 months (around 06/19/2023).

## 2022-12-18 ENCOUNTER — Encounter: Payer: Self-pay | Admitting: Nurse Practitioner

## 2022-12-18 NOTE — Progress Notes (Signed)
   Subjective:    Patient ID: Raven Burch, female    DOB: [birth year], 42 y.o.   MRN: [id number]  HPI    Review of Systems     Objective:   Physical Exam        Assessment & Plan:

## 2022-12-27 ENCOUNTER — Encounter (HOSPITAL_COMMUNITY): Payer: Self-pay

## 2022-12-27 ENCOUNTER — Ambulatory Visit (HOSPITAL_COMMUNITY)
Admission: RE | Admit: 2022-12-27 | Discharge: 2022-12-27 | Disposition: A | Discharge status: Home / Self Care | Payer: 59 | Source: Ambulatory Visit | Attending: Family Medicine | Admitting: Family Medicine

## 2022-12-27 DIAGNOSIS — Z1231 Encounter for screening mammogram for malignant neoplasm of breast: Secondary | ICD-10-CM | POA: Diagnosis not present

## 2023-01-20 ENCOUNTER — Telehealth: Payer: Self-pay

## 2023-01-20 NOTE — Telephone Encounter (Signed)
Patient dropped off document  Health Exam Form  , to be filled out by provider. Patient requested to send it via Call Patient to pick up within 7-days. Document is located in providers tray at front office.Please advise at Mobile [phone] (mobile)

## 2023-01-27 ENCOUNTER — Encounter: Payer: Self-pay | Admitting: Nurse Practitioner

## 2023-01-30 NOTE — Telephone Encounter (Signed)
If this form still needs to be filled out please forward to me, it is possible it has already been filled out

## 2023-01-31 NOTE — Telephone Encounter (Signed)
This form was placed on your signature folder on the 25th of April to be signed we did not get the form up front that was completed?

## 2023-02-01 NOTE — Telephone Encounter (Signed)
I certainly trust that the form was sent back here I have reviewed through all the papers on my desk as well as in my box I no longer have this form As for completing the form that I complete loads of forms  My recommendation is to talk with patient have them resubmit form and we will work on trying to complete it as timely as possible

## 2023-03-02 ENCOUNTER — Ambulatory Visit: Payer: 59 | Admitting: Family Medicine

## 2023-03-04 ENCOUNTER — Ambulatory Visit: Payer: BC Managed Care – PPO | Admitting: Family Medicine

## 2023-03-04 VITALS — BP 130/88 | HR 84 | Ht 65.0 in | Wt 228.8 lb

## 2023-03-04 DIAGNOSIS — M533 Sacrococcygeal disorders, not elsewhere classified: Secondary | ICD-10-CM | POA: Diagnosis not present

## 2023-03-04 MED ORDER — DICLOFENAC SODIUM 75 MG PO TBEC
75.0000 mg | DELAYED_RELEASE_TABLET | Freq: Two times a day (BID) | ORAL | 0 refills | Status: DC
Start: 2023-03-04 — End: 2023-04-01

## 2023-03-04 NOTE — Progress Notes (Signed)
   Subjective:    Patient ID: Raven Burch, female    DOB: [birth year], 42 y.o.   MRN: [id number]  HPI Patient arrives with right hip pain. Patient states radiate to lower back. Patient was significant low back pain on the right side radiates toward the right hip does not go down the buttock.  Denies numbness in the leg.  Denies any injury.  Review of Systems     Objective:   Physical Exam General-in no acute distress Eyes-no discharge Lungs-respiratory rate normal, CTA CV-no murmurs,RRR Extremities skin warm dry no edema Neuro grossly normal Behavior normal, alert  Nurse present straight leg raise negative pain in the right lower back with palpation decreased range of motion  No sign of weakness noted     Assessment & Plan:  Sacroiliac ligament inflammation-stretches shown pamphlet given NSAIDs twice a day for the next 10 to 14 days if not dramatically better within that time do x-rays give [place] update within 3 weeks may need orthopedic referral

## 2023-03-21 ENCOUNTER — Encounter: Payer: Self-pay | Admitting: Family Medicine

## 2023-03-22 ENCOUNTER — Encounter: Payer: Self-pay | Admitting: Family Medicine

## 2023-03-23 ENCOUNTER — Other Ambulatory Visit: Payer: Self-pay | Admitting: Nurse Practitioner

## 2023-03-23 MED ORDER — CIPRO HC 0.2-1 % OT SUSP: 3.0000 [drp] | Freq: Two times a day (BID) | OTIC | 0 refills | Status: DC

## 2023-03-25 ENCOUNTER — Other Ambulatory Visit: Payer: Self-pay | Admitting: Nurse Practitioner

## 2023-03-25 MED ORDER — NEOMYCIN-POLYMYXIN-HC 3.5-10000-1 OT SOLN: 4.0000 [drp] | Freq: Four times a day (QID) | OTIC | 0 refills | Status: DC

## 2023-03-31 ENCOUNTER — Other Ambulatory Visit: Payer: Self-pay | Admitting: Family Medicine

## 2023-05-07 ENCOUNTER — Other Ambulatory Visit: Payer: Self-pay | Admitting: Family Medicine

## 2023-07-27 ENCOUNTER — Ambulatory Visit: Payer: BC Managed Care – PPO

## 2023-07-27 DIAGNOSIS — Z23 Encounter for immunization: Secondary | ICD-10-CM

## 2023-08-01 ENCOUNTER — Encounter: Payer: Self-pay | Admitting: Family Medicine

## 2023-08-01 ENCOUNTER — Ambulatory Visit (HOSPITAL_COMMUNITY)
Admission: RE | Admit: 2023-08-01 | Discharge: 2023-08-01 | Disposition: A | Discharge status: Home / Self Care | Payer: BC Managed Care – PPO | Source: Ambulatory Visit | Attending: Family Medicine | Admitting: Family Medicine

## 2023-08-01 ENCOUNTER — Ambulatory Visit: Payer: BC Managed Care – PPO | Admitting: Family Medicine

## 2023-08-01 VITALS — BP 108/78 | HR 75 | Temp 97.7°F | Ht 65.0 in | Wt 244.0 lb

## 2023-08-01 DIAGNOSIS — M79671 Pain in right foot: Secondary | ICD-10-CM | POA: Insufficient documentation

## 2023-08-01 MED ORDER — PREDNISONE 10 MG PO TABS
ORAL_TABLET | ORAL | 0 refills | Status: DC
Start: 2023-08-01 — End: 2023-09-05

## 2023-08-01 NOTE — Progress Notes (Signed)
Subjective:  Patient ID: Raven Burch, female    DOB: [birth year]  Age: 42 y.o. MRN: [id number]  CC:   Chief Complaint  Patient presents with   right foot pain    Stepped on rock Saturday running after dog     HPI:  42 year old female presents for evaluation of the above.  Patient states that she was wearing crocs on Saturday and stepped on a rock.  Since then she has had pain in the right heel.  Mild swelling.  No relieving factors.  No reports of ankle injury.  No other associated symptoms.  No other complaints.  Patient Active Problem List   Diagnosis Date Noted   Pain of right heel 08/01/2023   Essential hypertension 11/28/2020   PCOS (polycystic ovarian syndrome) 01/16/2016   Vitamin D deficiency 01/16/2016   Hirsutism 01/15/2016   Morbid obesity (HCC) 01/15/2016    Social Hx   Social History   Socioeconomic History   Marital status: Married    Spouse name: Not on file   Number of children: Not on file   Years of education: Not on file   Highest education level: Not on file  Occupational History   Not on file  Tobacco Use   Smoking status: Never   Smokeless tobacco: Never  Substance and Sexual Activity   Alcohol use: No    Alcohol/week: 0.0 standard drinks of alcohol   Drug use: No   Sexual activity: Yes    Birth control/protection: Surgical  Other Topics Concern   Not on file  Social History Narrative   Not on file   Social Determinants of Health   Financial Resource Strain: Not on file  Food Insecurity: Not on file  Transportation Needs: Not on file  Physical Activity: Not on file  Stress: Not on file  Social Connections: Not on file    Review of Systems Per HPI  Objective:  BP 108/78   Pulse 75   Temp 97.7 F (36.5 C)   Ht 5\' 5"  (1.651 m)   Wt 244 lb (110.7 kg)   SpO2 98%   BMI 40.60 kg/m      08/01/2023    4:12 PM 03/04/2023    3:53 PM 03/04/2023    3:18 PM  BP/Weight  Systolic BP 108 130 135  Diastolic BP 78 88 90  Wt. (Lbs)  244  228.8  BMI 40.6 kg/m2  38.07 kg/m2    Physical Exam Constitutional:      General: She is not in acute distress.    Appearance: Normal appearance. She is obese.  HENT:     Head: Normocephalic and atraumatic.  Musculoskeletal:     Comments: Right foot with exquisite tenderness over the plantar aspect of the right heel at the attachment site of the plantar fascia.  Neurological:     Mental Status: She is alert.     Lab Results  Component Value Date   WBC 10.9 (H) 12/13/2022   HGB 13.9 12/13/2022   HCT 40.7 12/13/2022   PLT 335 12/13/2022   GLUCOSE 77 12/13/2022   CHOL 154 12/13/2022   TRIG 70 12/13/2022   HDL 40 12/13/2022   LDLCALC 100 (H) 12/13/2022   ALT 19 12/13/2022   AST 18 12/13/2022   NA 141 12/13/2022   K 4.3 12/13/2022   CL 105 12/13/2022   CREATININE 0.78 12/13/2022   BUN 8 12/13/2022   CO2 20 12/13/2022   TSH 3.550 12/01/2020   HGBA1C 5.4 12/13/2022  Assessment & Plan:   Problem List Items Addressed This Visit       Other   Pain of right heel - Primary    Suspected plantar fasciitis placed on exam.  Treating with prednisone, rest, ice, elevation.  Advised to wear good, supportive shoes.  Obtaining x-ray to ensure no occult fracture.      Relevant Orders   DG Os Calcis Right    Meds ordered this encounter  Medications   predniSONE (DELTASONE) 10 MG tablet    Sig: 50 mg daily x 2 days, then 40 mg daily x 2 days, then 30 mg daily x 2 days, then 20 mg daily x 2 days, then 10 mg daily x 2 days.    Dispense:  30 tablet    Refill:  0    Jora Galluzzo DO [facility]

## 2023-08-01 NOTE — Patient Instructions (Signed)
Xray at the hospital.  Prednisone as prescribed.  Rest, ice, elevation.  Wear good supportive shoes.   Take care  Dr. Adriana Simas

## 2023-08-01 NOTE — Assessment & Plan Note (Signed)
Suspected plantar fasciitis placed on exam.  Treating with prednisone, rest, ice, elevation.  Advised to wear good, supportive shoes.  Obtaining x-ray to ensure no occult fracture.

## 2023-08-03 ENCOUNTER — Ambulatory Visit: Payer: BC Managed Care – PPO

## 2023-08-16 ENCOUNTER — Encounter: Payer: Self-pay | Admitting: Family Medicine

## 2023-08-17 NOTE — Telephone Encounter (Signed)
Everlene Other G, DO     Still not read. Can we see if radiology can move this up the list?

## 2023-08-18 ENCOUNTER — Encounter: Payer: Self-pay | Admitting: Family Medicine

## 2023-08-19 ENCOUNTER — Other Ambulatory Visit: Payer: Self-pay

## 2023-08-19 DIAGNOSIS — M7730 Calcaneal spur, unspecified foot: Secondary | ICD-10-CM

## 2023-08-19 NOTE — Telephone Encounter (Signed)
See other chart note

## 2023-08-19 NOTE — Telephone Encounter (Signed)
I did call radiology regarding this , they said it has not been read yet and will try and move it up to be resulted

## 2023-09-05 ENCOUNTER — Encounter: Payer: Self-pay | Admitting: Podiatry

## 2023-09-05 ENCOUNTER — Ambulatory Visit: Payer: BC Managed Care – PPO | Admitting: Podiatry

## 2023-09-05 ENCOUNTER — Ambulatory Visit (INDEPENDENT_AMBULATORY_CARE_PROVIDER_SITE_OTHER): Payer: BC Managed Care – PPO

## 2023-09-05 DIAGNOSIS — R262 Difficulty in walking, not elsewhere classified: Secondary | ICD-10-CM

## 2023-09-05 DIAGNOSIS — S96219A Strain of intrinsic muscle and tendon at ankle and foot level, unspecified foot, initial encounter: Secondary | ICD-10-CM

## 2023-09-05 DIAGNOSIS — S92001A Unspecified fracture of right calcaneus, initial encounter for closed fracture: Secondary | ICD-10-CM

## 2023-09-05 MED ORDER — MELOXICAM 15 MG PO TABS: 15.0000 mg | ORAL_TABLET | Freq: Every day | ORAL | 1 refills | Status: DC

## 2023-09-05 NOTE — Progress Notes (Unsigned)
Chief Complaint  Patient presents with   Foot Injury    Plantar heel right - went chasing after dog and heel stepped hard onto a rock on 07/30/2023, went to Urgent Care-xrayed said had a heel spur that was cracked and possibly torn fascia, rx'd prednisone, xrayed were reviewed at a later date-once he found fracture doc referred here, tried ice and occasional Ibuprofen but still pretty sore   New Patient (Initial Visit)   HPI: 42 y.o. female presents today in with concern of pain in the plantar aspect of the right heel.  She states on November 2 she was chasing after her dog and stepped hard onto a rock and immediately experienced pain in the heel.  As she went to urgent care and was informed that there was a heel spur that was "cracked".  She was also informed she possibly had torn fascia.  As she still has continued pain even though it has slightly improved since the injury.  She denies any bruising or swelling to the area.  Does note that her heel begins throbbing the longer she stands on her feet at work.  No past medical history on file.  No past surgical history on file.  No Known Allergies   Physical Exam: Palpable pedal pulses right foot.  No edema or ecchymosis is noted.  No open lesions are noted.  There is pain on palpation to the plantar aspect of the right heel.  Some discomfort on palpation to the proximal plantar fascia as well.  Radiographic Exam (right heel, 2 weightbearing views, 09/05/2023):  Normal osseous mineralization.   This view was compared to her urgent care x-rays: On her original x-rays last month there was an avulsion of partial fracture of the inferior calcaneal spur with less than 1 cm of gapping along the plantar open portion of the fracture fragment.  On today's views the fracture appears approximately 85% filled in/healed at this time.  No other bony abnormality is seen on lateral view.    Assessment/Plan of Care: 1. Closed nondisplaced fracture of right  calcaneus, unspecified portion of calcaneus, initial encounter   2. Strain of fascia of intrinsic muscle of foot   3. Difficulty walking      Meds ordered this encounter  Medications   meloxicam (MOBIC) 15 MG tablet    Sig: Take 1 tablet (15 mg total) by mouth daily.    Dispense:  30 tablet    Refill:  1   PR FT ARCH SUPRT PREMOLD LONGIT  Discussed clinical and radiographic findings with patient today.  Since the patient has continued working since the initial injury, she states that she has 1 more week of work before she goes on holiday break and will be off of work for 2 weeks.  Offered to keep her out of work for the remaining week but she would like to try to continue working.  As she was fitted for power step inserts to provide more arch support than what she is currently wearing in her shoes.  Also, if the patient needs additional support for longer days of standing at work, she was shown how to apply a Low[employer] strapping to the plantar aspect of the right foot.  This was performed today and she can keep this on for 1 to 2 days.  She was encouraged to purchase KT tape over-the-counter and [employer] videos on plantar fascia taping to find an easy way to apply it herself.  She can do this as needed  Will have the patient discontinue any over-the-counter ibuprofen and we will send in prescription meloxicam for pain and anti-inflammatory effects.  Follow-up in approximately 2 weeks for rex-ray of evaluation of the inferior calcaneal spur fracture.    Clerance Lav, DPM, FACFAS [facility]     [address] [place], [place] [address]                Office [phone]  Fax [phone]

## 2023-09-10 ENCOUNTER — Other Ambulatory Visit: Payer: Self-pay | Admitting: Nurse Practitioner

## 2023-09-26 ENCOUNTER — Ambulatory Visit (INDEPENDENT_AMBULATORY_CARE_PROVIDER_SITE_OTHER): Payer: BC Managed Care – PPO | Admitting: Podiatry

## 2023-09-30 ENCOUNTER — Telehealth: Payer: Self-pay | Admitting: Family Medicine

## 2023-09-30 NOTE — Telephone Encounter (Signed)
 Patient is requesting lab for upcoming appointment

## 2023-10-03 ENCOUNTER — Other Ambulatory Visit: Payer: Self-pay | Admitting: Nurse Practitioner

## 2023-10-03 ENCOUNTER — Encounter: Payer: Self-pay | Admitting: Nurse Practitioner

## 2023-10-03 DIAGNOSIS — Z79899 Other long term (current) drug therapy: Secondary | ICD-10-CM

## 2023-10-03 DIAGNOSIS — D72828 Other elevated white blood cell count: Secondary | ICD-10-CM

## 2023-10-03 DIAGNOSIS — I10 Essential (primary) hypertension: Secondary | ICD-10-CM

## 2023-11-22 ENCOUNTER — Other Ambulatory Visit: Payer: Self-pay | Admitting: Podiatry

## 2023-11-26 LAB — COMPREHENSIVE METABOLIC PANEL
ALT: 26 IU/L (ref 0–32)
AST: 19 IU/L (ref 0–40)
Albumin: 4.4 g/dL (ref 3.9–4.9)
Alkaline Phosphatase: 105 IU/L (ref 44–121)
BUN/Creatinine Ratio: 13 (ref 9–23)
BUN: 11 mg/dL (ref 6–24)
Bilirubin Total: 0.6 mg/dL (ref 0.0–1.2)
CO2: 21 mmol/L (ref 20–29)
Calcium: 9.6 mg/dL (ref 8.7–10.2)
Chloride: 105 mmol/L (ref 96–106)
Creatinine, Ser: 0.86 mg/dL (ref 0.57–1.00)
Globulin, Total: 2.2 g/dL (ref 1.5–4.5)
Glucose: 103 mg/dL — ABNORMAL HIGH (ref 70–99)
Potassium: 4.9 mmol/L (ref 3.5–5.2)
Sodium: 140 mmol/L (ref 134–144)
Total Protein: 6.6 g/dL (ref 6.0–8.5)
eGFR: 86 mL/min/{1.73_m2} (ref >59)

## 2023-11-26 LAB — LIPID PANEL
Chol/HDL Ratio: 4.7 ratio — ABNORMAL HIGH (ref 0.0–4.4)
Cholesterol, Total: 160 mg/dL (ref 100–199)
HDL: 34 mg/dL — ABNORMAL LOW (ref >39)
LDL Chol Calc (NIH): 100 mg/dL — ABNORMAL HIGH (ref 0–99)
Triglycerides: 149 mg/dL (ref 0–149)
VLDL Cholesterol Cal: 26 mg/dL (ref 5–40)

## 2023-11-26 LAB — CBC WITH DIFFERENTIAL/PLATELET
Basophils Absolute: 0.1 10*3/uL (ref 0.0–0.2)
Basos: 1 %
EOS (ABSOLUTE): 0.1 10*3/uL (ref 0.0–0.4)
Eos: 1 %
Hematocrit: 42.5 % (ref 34.0–46.6)
Hemoglobin: 14.1 g/dL (ref 11.1–15.9)
Immature Grans (Abs): 0 10*3/uL (ref 0.0–0.1)
Immature Granulocytes: 0 %
Lymphocytes Absolute: 2.1 10*3/uL (ref 0.7–3.1)
Lymphs: 24 %
MCH: 31.1 pg (ref 26.6–33.0)
MCHC: 33.2 g/dL (ref 31.5–35.7)
MCV: 94 fL (ref 79–97)
Monocytes Absolute: 0.5 10*3/uL (ref 0.1–0.9)
Monocytes: 5 %
Neutrophils Absolute: 5.9 10*3/uL (ref 1.4–7.0)
Neutrophils: 69 %
Platelets: 361 10*3/uL (ref 150–450)
RBC: 4.53 x10E6/uL (ref 3.77–5.28)
RDW: 11.8 % (ref 11.7–15.4)
WBC: 8.6 10*3/uL (ref 3.4–10.8)

## 2023-12-08 ENCOUNTER — Ambulatory Visit: Payer: Self-pay | Admitting: Nurse Practitioner

## 2023-12-08 VITALS — BP 143/92 | HR 86 | Temp 98.4°F | Ht 65.0 in | Wt 250.8 lb

## 2023-12-08 DIAGNOSIS — Z01419 Encounter for gynecological examination (general) (routine) without abnormal findings: Secondary | ICD-10-CM

## 2023-12-08 DIAGNOSIS — Z6841 Body Mass Index (BMI) 40.0 and over, adult: Secondary | ICD-10-CM | POA: Diagnosis not present

## 2023-12-08 DIAGNOSIS — Z01411 Encounter for gynecological examination (general) (routine) with abnormal findings: Secondary | ICD-10-CM

## 2023-12-08 DIAGNOSIS — I10 Essential (primary) hypertension: Secondary | ICD-10-CM | POA: Diagnosis not present

## 2023-12-08 MED ORDER — PHENTERMINE HCL 37.5 MG PO TABS
ORAL_TABLET | ORAL | 0 refills | Status: DC
Start: 2023-12-08 — End: 2024-01-09

## 2023-12-08 NOTE — Progress Notes (Unsigned)
 The patient comes in today for a wellness visit.    A review of their health history was completed.  A review of medications was also completed.  Any needed refills; No  Eating habits: Could use work  Falls/  MVA accidents in past few months: No  Regular exercise: No  Specialist pt sees on regular basis: No  Preventative health issues were discussed.   Additional concerns: None at this time No pap or breast exam

## 2023-12-08 NOTE — Progress Notes (Unsigned)
 Subjective:    Patient ID: Raven Burch, female    DOB: April 12, 1981, 43 y.o.   MRN: [id number]  HPI The patient comes in today for a wellness visit.  A review of their health history was completed. A review of medications was also completed.  Any needed refills; Lisinopril  Eating habits: Poor. Says she has been snacking on potato chips more recently. No sugary drinks.   Falls/  MVA accidents in past few months: No  Regular exercise: Does not participate in exercise. Works an active job at [employer].   Sleep: Good  Menstrual cycles/sexual history: Regular cycles lasting 3-4 days. Light flow. Married, same female sexual partner.   Specialist pt sees on regular basis: No  Regular eye/dental exams: Yes  Preventative health issues were discussed. Needs a mammogram scheduled.   Additional concerns: Would like to start back on Phentermine for weight loss. Reports she took the medication in the past and it helped. No side effects. Has had weight gain since last physical.    Review of Systems  Constitutional:  Negative for activity change, appetite change, fatigue, fever and unexpected weight change.  HENT:  Negative for sore throat and trouble swallowing.   Respiratory:  Negative for cough, chest tightness, shortness of breath and wheezing.   Cardiovascular:  Negative for chest pain and palpitations.  Gastrointestinal:  Negative for abdominal distention, abdominal pain, constipation, diarrhea, nausea and vomiting.  Genitourinary:  Negative for difficulty urinating, dysuria, frequency, genital sores, menstrual problem, pelvic pain, urgency and vaginal discharge.  Psychiatric/Behavioral:  Negative for sleep disturbance. The patient is not nervous/anxious.       Objective:   Physical Exam Vitals and nursing note reviewed. Exam conducted with a chaperone present.  Constitutional:      Appearance: Normal appearance. She is normal weight. She is not ill-appearing.  HENT:      Nose: No congestion.     Mouth/Throat:     Mouth: Mucous membranes are moist.     Pharynx: No posterior oropharyngeal erythema.  Neck:     Thyroid: No thyroid mass or thyroid tenderness.  Cardiovascular:     Rate and Rhythm: Normal rate and regular rhythm.     Heart sounds: Normal heart sounds, S1 normal and S2 normal. No murmur heard. Pulmonary:     Effort: Pulmonary effort is normal. No respiratory distress.     Breath sounds: Normal breath sounds.  Chest:  Breasts:    Right: No inverted nipple, mass, skin change or tenderness.     Left: No inverted nipple, mass, skin change or tenderness.  Abdominal:     General: There is no distension.     Palpations: Abdomen is soft.     Tenderness: There is no abdominal tenderness.     Comments: No obvious masses or abnormalities.   Lymphadenopathy:     Cervical: No cervical adenopathy.     Upper Body:     Right upper body: No supraclavicular, axillary or pectoral adenopathy.     Left upper body: No supraclavicular, axillary or pectoral adenopathy.  Skin:    General: Skin is warm and dry.  Neurological:     Mental Status: She is alert.     Gait: Gait normal.  Psychiatric:        Mood and Affect: Mood normal.        Behavior: Behavior normal.        Thought Content: Thought content normal.        Judgment: Judgment  normal.    Vitals:   12/08/23 1342 12/08/23 1354  BP: (!) 142/94 (!) 143/92  Pulse: 86   Temp: 98.4 F (36.9 C)   Height: 5\' 5"  (1.651 m)   Weight: 250 lb 12.8 oz (113.8 kg)   SpO2: 98%   TempSrc: Temporal   BMI (Calculated): 41.74        12/08/2023    1:52 PM 08/01/2023    4:20 PM 03/04/2023    3:20 PM 04/09/2021    8:30 AM 02/27/2021    9:26 AM  Depression screen PHQ 2/9  Decreased Interest 0 0 0 0 0  Down, Depressed, Hopeless 0 0 0 0 0  PHQ - 2 Score 0 0 0 0 0  Altered sleeping 0 0 1    Tired, decreased energy 0 0 0    Change in appetite 1 1 0    Feeling bad or failure about yourself  0 1 0    Trouble  concentrating 0 0 0    Moving slowly or fidgety/restless 0 0 0    Suicidal thoughts 0 0 0    PHQ-9 Score 1 2 1     Difficult doing work/chores Not difficult at all Not difficult at all Not difficult at all         12/08/2023    1:52 PM 08/01/2023    4:20 PM 03/04/2023    3:20 PM  GAD 7 : Generalized Anxiety Score  Nervous, Anxious, on Edge 0 0 0  Control/stop worrying 0 0 0  Worry too much - different things 0 0 1  Trouble relaxing 0 0 0  Restless 0 0 0  Easily annoyed or irritable 0 0 0  Afraid - awful might happen 0 0 0  Total GAD 7 Score 0 0 1  Anxiety Difficulty Not difficult at all Not difficult at all Not difficult at all    Recent Results (from the past 2160 hours)  CBC with Differential/Platelet     Status: None   Collection Time: 11/25/23  8:38 AM  Result Value Ref Range   WBC 8.6 3.4 - 10.8 x10E3/uL   RBC 4.53 3.77 - 5.28 x10E6/uL   Hemoglobin 14.1 11.1 - 15.9 g/dL   Hematocrit 78.4 69.6 - 46.6 %   MCV 94 79 - 97 fL   MCH 31.1 26.6 - 33.0 pg   MCHC 33.2 31.5 - 35.7 g/dL   RDW 29.5 28.4 - 13.2 %   Platelets 361 150 - 450 x10E3/uL   Neutrophils 69 Not Estab. %   Lymphs 24 Not Estab. %   Monocytes 5 Not Estab. %   Eos 1 Not Estab. %   Basos 1 Not Estab. %   Neutrophils Absolute 5.9 1.4 - 7.0 x10E3/uL   Lymphocytes Absolute 2.1 0.7 - 3.1 x10E3/uL   Monocytes Absolute 0.5 0.1 - 0.9 x10E3/uL   EOS (ABSOLUTE) 0.1 0.0 - 0.4 x10E3/uL   Basophils Absolute 0.1 0.0 - 0.2 x10E3/uL   Immature Granulocytes 0 Not Estab. %   Immature Grans (Abs) 0.0 0.0 - 0.1 x10E3/uL  Comprehensive metabolic panel     Status: Abnormal   Collection Time: 11/25/23  8:38 AM  Result Value Ref Range   Glucose 103 (H) 70 - 99 mg/dL   BUN 11 6 - 24 mg/dL   Creatinine, Ser 4.40 0.57 - 1.00 mg/dL   eGFR 86 >10 UV/OZD/6.64   BUN/Creatinine Ratio 13 9 - 23   Sodium 140 134 - 144 mmol/L   Potassium  4.9 3.5 - 5.2 mmol/L   Chloride 105 96 - 106 mmol/L   CO2 21 20 - 29 mmol/L   Calcium 9.6 8.7 - 10.2  mg/dL   Total Protein 6.6 6.0 - 8.5 g/dL   Albumin 4.4 3.9 - 4.9 g/dL   Globulin, Total 2.2 1.5 - 4.5 g/dL   Bilirubin Total 0.6 0.0 - 1.2 mg/dL   Alkaline Phosphatase 105 44 - 121 IU/L   AST 19 0 - 40 IU/L   ALT 26 0 - 32 IU/L  Lipid panel     Status: Abnormal   Collection Time: 11/25/23  8:38 AM  Result Value Ref Range   Cholesterol, Total 160 100 - 199 mg/dL   Triglycerides 161 0 - 149 mg/dL   HDL 34 (L) >09 mg/dL   VLDL Cholesterol Cal 26 5 - 40 mg/dL   LDL Chol Calc (NIH) 604 (H) 0 - 99 mg/dL   Chol/HDL Ratio 4.7 (H) 0.0 - 4.4 ratio    Comment:                                   T. Chol/HDL Ratio                                             Men  Women                               1/2 Avg.Risk  3.4    3.3                                   Avg.Risk  5.0    4.4                                2X Avg.Risk  9.6    7.1                                3X Avg.Risk 23.4   11.0        Assessment & Plan:  1. Well woman exam (Primary) Adult wellness-complete.wellness physical was conducted today. Importance of diet and exercise were discussed in detail.  Importance of stress reduction and healthy living were discussed.  In addition to this a discussion regarding safety was also covered.  We also reviewed over immunizations and gave recommendations regarding current immunization needed for age.   In addition to this additional areas were also touched on including: Preventative health exams needed: Mammogram due April 2025. Gave patient contact for central scheduling.   Patient was advised yearly wellness exam.  Discussed replacing snacks out with healthier options, and increasing exercise. Advised patient to set a personal goal of exercise.   2. Essential hypertension BP elevated today in the office. Patient reports that she did not take her Lisinopril today. Does not check her blood pressure at home but does have a monitor. Advised patient to take her Lisinopril either at morning or  at bedtime, and monitor it closely after starting Phentermine.   3. Morbid obesity Meds ordered this encounter  Medications  phentermine (ADIPEX-P) 37.5 MG tablet    Sig: Take one tab po qam for weight loss    Dispense:  30 tablet    Refill:  0    Supervising Provider:   Lilyan Punt A [[id number]]   -Advised patient to monitor blood pressure closely while taking medication. Will follow up with her in one month to check on blood pressure and follow-up on medication. DC med and contact office if any significant elevation.   Return in about 4 weeks (around 01/05/2024).   I have seen and examined this patient alongside the NP student. I have reviewed and verified the student note and agree with the assessment and plan.  Sherie Don, FNP

## 2023-12-09 ENCOUNTER — Encounter: Payer: Self-pay | Admitting: Nurse Practitioner

## 2023-12-09 MED ORDER — LISINOPRIL 5 MG PO TABS: ORAL_TABLET | ORAL | 1 refills | Status: DC

## 2023-12-15 ENCOUNTER — Other Ambulatory Visit (HOSPITAL_COMMUNITY): Payer: Self-pay | Admitting: Family Medicine

## 2023-12-15 DIAGNOSIS — Z1231 Encounter for screening mammogram for malignant neoplasm of breast: Secondary | ICD-10-CM

## 2023-12-30 ENCOUNTER — Ambulatory Visit (HOSPITAL_COMMUNITY)
Admission: RE | Admit: 2023-12-30 | Discharge: 2023-12-30 | Disposition: A | Discharge status: Home / Self Care | Source: Ambulatory Visit | Attending: Family Medicine | Admitting: Family Medicine

## 2023-12-30 DIAGNOSIS — Z1231 Encounter for screening mammogram for malignant neoplasm of breast: Secondary | ICD-10-CM | POA: Insufficient documentation

## 2024-01-09 ENCOUNTER — Encounter: Payer: Self-pay | Admitting: Nurse Practitioner

## 2024-01-09 ENCOUNTER — Ambulatory Visit: Admitting: Nurse Practitioner

## 2024-01-09 VITALS — BP 111/80 | HR 90 | Temp 97.7°F | Ht 65.0 in | Wt 239.0 lb

## 2024-01-09 DIAGNOSIS — E282 Polycystic ovarian syndrome: Secondary | ICD-10-CM

## 2024-01-09 DIAGNOSIS — I10 Essential (primary) hypertension: Secondary | ICD-10-CM | POA: Diagnosis not present

## 2024-01-09 MED ORDER — PHENTERMINE HCL 37.5 MG PO TABS: ORAL_TABLET | ORAL | 2 refills | Status: DC

## 2024-01-09 NOTE — Progress Notes (Signed)
 Subjective:    Patient ID: Raven Burch, female    DOB: October 14, 1980, 43 y.o.   MRN: [id number]  HPI Presents for recheck on blood pressure and weight.  Started phentermine at her last visit 1 month ago.  Other than dry mouth is tolerating it well with minimal side effects.  Has brought her blood pressure and pulse record with her today.  Has increased her activity, walking at the track 1 mile 2-3 times per week.  Doing much better with her diet, has decreased her intake of chips to every 2 to 3 days as a snack.  Fluids are diet soda and trying to increase water. BP record since starting phentermine 110-143 systolic with most diastolic readings in the 80s.   Review of Systems  Respiratory:  Negative for cough, chest tightness and shortness of breath.   Cardiovascular:  Negative for chest pain and palpitations.      01/09/2024    2:31 PM  Depression screen PHQ 2/9  Decreased Interest 0  Down, Depressed, Hopeless 0  PHQ - 2 Score 0  Altered sleeping 0  Tired, decreased energy 1  Change in appetite 1  Feeling bad or failure about yourself  0  Trouble concentrating 0  Moving slowly or fidgety/restless 0  Suicidal thoughts 0  PHQ-9 Score 2  Difficult doing work/chores Not difficult at all      01/09/2024    2:31 PM 12/08/2023    1:52 PM 08/01/2023    4:20 PM 03/04/2023    3:20 PM  GAD 7 : Generalized Anxiety Score  Nervous, Anxious, on Edge 0 0 0 0  Control/stop worrying 0 0 0 0  Worry too much - different things 0 0 0 1  Trouble relaxing 0 0 0 0  Restless 0 0 0 0  Easily annoyed or irritable 0 0 0 0  Afraid - awful might happen 0 0 0 0  Total GAD 7 Score 0 0 0 1  Anxiety Difficulty Not difficult at all Not difficult at all Not difficult at all Not difficult at all    Social History   Tobacco Use   Smoking status: Never   Smokeless tobacco: Never  Substance Use Topics   Alcohol use: No    Alcohol/week: 0.0 standard drinks of alcohol   Drug use: No         Objective:   Physical Exam NAD.  Alert, oriented.  Calm cheerful affect.  Lungs clear.  Heart regular rate rhythm. Today's Vitals   01/09/24 1426  BP: 111/80  Pulse: 90  Temp: 97.7 F (36.5 C)  SpO2: 99%  Weight: 239 lb (108.4 kg)  Height: 5\' 5"  (1.651 m)   Body mass index is 39.77 kg/m. Has lost 11 pounds since her last visit 1 month ago.       Assessment & Plan:   Problem List Items Addressed This Visit       Cardiovascular and Mediastinum   Essential hypertension - Primary     Endocrine   PCOS (polycystic ovarian syndrome)     Other   Morbid obesity (HCC)   Relevant Medications   phentermine (ADIPEX-P) 37.5 MG tablet   Meds ordered this encounter  Medications   phentermine (ADIPEX-P) 37.5 MG tablet    Sig: Take one tab po qam for weight loss    Dispense:  30 tablet    Refill:  2    Supervising Provider:   Babs Sciara [phone]   Encourage continue  healthy lifestyle habits including regular activity and sugar and simple carbs.  Will continue phentermine at this time to assist with her weight loss.   Return in about 3 months (around 04/09/2024).

## 2024-04-12 ENCOUNTER — Encounter: Payer: Self-pay | Admitting: Nurse Practitioner

## 2024-04-12 ENCOUNTER — Ambulatory Visit: Admitting: Nurse Practitioner

## 2024-04-12 VITALS — BP 120/84 | HR 110 | Temp 98.4°F | Ht 65.0 in | Wt 235.2 lb

## 2024-04-12 DIAGNOSIS — I10 Essential (primary) hypertension: Secondary | ICD-10-CM

## 2024-04-12 MED ORDER — PHENTERMINE HCL 37.5 MG PO TABS: ORAL_TABLET | ORAL | 2 refills | Status: AC

## 2024-04-12 NOTE — Progress Notes (Signed)
 Subjective:    Patient ID: Raven Burch, female    DOB: 1980/10/18, 43 y.o.   MRN: [id number]  HPI Presents for recheck.  Currently on phentermine  for weight loss.  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  States her diet varies, tries eat as healthy as possible.  Her main issue is snacking in the afternoons.  Denies any side effects from phentermine  other than dry mouth.   Review of Systems  Respiratory:  Negative for cough, chest tightness and shortness of breath.   Cardiovascular:  Negative for chest pain, palpitations and leg swelling.      04/12/2024    2:09 PM  Depression screen PHQ 2/9  Decreased Interest 0  Down, Depressed, Hopeless 0  PHQ - 2 Score 0  Altered sleeping 1  Tired, decreased energy 0  Change in appetite 1  Feeling bad or failure about yourself  0  Trouble concentrating 0  Moving slowly or fidgety/restless 0  Suicidal thoughts 0  PHQ-9 Score 2  Difficult doing work/chores Not difficult at all      04/12/2024    2:09 PM 01/09/2024    2:31 PM 12/08/2023    1:52 PM 08/01/2023    4:20 PM  GAD 7 : Generalized Anxiety Score  Nervous, Anxious, on Edge 0 0 0 0  Control/stop worrying 0 0 0 0  Worry too much - different things 0 0 0 0  Trouble relaxing 0 0 0 0  Restless 0 0 0 0  Easily annoyed or irritable 0 0 0 0  Afraid - awful might happen 0 0 0 0  Total GAD 7 Score 0 0 0 0  Anxiety Difficulty  Not difficult at all Not difficult at all Not difficult at all    Social History   Tobacco Use   Smoking status: Never   Smokeless tobacco: Never  Substance Use Topics   Alcohol use: No    Alcohol/week: 0.0 standard drinks of alcohol   Drug use: No       Objective:   Physical Exam Vitals and nursing note reviewed.  Constitutional:      General: She is not in acute distress. Cardiovascular:     Rate and Rhythm: Normal rate and regular rhythm.     Heart sounds: Normal heart sounds.  Pulmonary:      Effort: Pulmonary effort is normal.     Breath sounds: Normal breath sounds.  Neurological:     Mental Status: She is alert.  Psychiatric:        Mood and Affect: Mood normal.        Behavior: Behavior normal.        Thought Content: Thought content normal.        Judgment: Judgment normal.    Today's Vitals   04/12/24 1403  BP: 120/84  Pulse: (!) 110  Temp: 98.4 F (36.9 C)  SpO2: 98%  Weight: 235 lb 3.2 oz (106.7 kg)  Height: 5' 5 (1.651 m)   Body mass index is 39.14 kg/m.  Has lost 15 pounds total.       Assessment & Plan:   Problem List Items Addressed This Visit       Cardiovascular and Mediastinum   Essential hypertension - Primary     Other   Morbid obesity (HCC)   Relevant Medications   phentermine  (ADIPEX-P ) 37.5 MG tablet   Meds ordered this encounter  Medications   phentermine  (ADIPEX-P )  37.5 MG tablet    Sig: Take one tab po qam for weight loss    Dispense:  30 tablet    Refill:  2    Supervising Provider:   ALPHONSA HAMILTON A [[id number]]   Continue phentermine  for 3 more months then discontinue.  Encouraged continued regular activity and healthy diet.  As patient continues to lose weight, recommend she monitor her blood pressure.  We may need to decrease or stop her blood pressure medication at some point. Return for physical in March 2026.

## 2024-06-03 ENCOUNTER — Encounter: Payer: Self-pay | Admitting: Nurse Practitioner

## 2024-06-04 ENCOUNTER — Ambulatory Visit: Admitting: Family Medicine

## 2024-06-04 VITALS — BP 128/82 | HR 100 | Temp 98.1°F | Ht 65.0 in | Wt 230.0 lb

## 2024-06-04 DIAGNOSIS — H6501 Acute serous otitis media, right ear: Secondary | ICD-10-CM | POA: Diagnosis not present

## 2024-06-04 DIAGNOSIS — H6590 Unspecified nonsuppurative otitis media, unspecified ear: Secondary | ICD-10-CM | POA: Insufficient documentation

## 2024-06-04 MED ORDER — AMOXICILLIN-POT CLAVULANATE 875-125 MG PO TABS: 1.0000 | ORAL_TABLET | Freq: Two times a day (BID) | ORAL | 0 refills | Status: DC

## 2024-06-04 MED ORDER — PROMETHAZINE-DM 6.25-15 MG/5ML PO SYRP: 5.0000 mL | ORAL_SOLUTION | Freq: Four times a day (QID) | ORAL | 0 refills | Status: DC | PRN

## 2024-06-04 NOTE — Progress Notes (Signed)
 Subjective:  Patient ID: Raven Burch, female    DOB: Oct 08, 1980  Age: 43 y.o. MRN: [id number]  CC:   Chief Complaint  Patient presents with   URI    HPI:  43 year old female presents with respiratory symptoms.   Symptoms started on Friday. Reports congestion, cough, sore throat, ear discomfort (right ear). No fever. Other individuals are sick with the same symptoms at work. No relieving factors. No other complaints today.  Patient Active Problem List   Diagnosis Date Noted   Serous otitis media 06/04/2024   Pain of right heel 08/01/2023   Essential hypertension 11/28/2020   PCOS (polycystic ovarian syndrome) 01/16/2016   Vitamin D  deficiency 01/16/2016   Hirsutism 01/15/2016   Morbid obesity (HCC) 01/15/2016    Social Hx   Social History   Socioeconomic History   Marital status: Married    Spouse name: Not on file   Number of children: Not on file   Years of education: Not on file   Highest education level: Not on file  Occupational History   Not on file  Tobacco Use   Smoking status: Never   Smokeless tobacco: Never  Substance and Sexual Activity   Alcohol use: No    Alcohol/week: 0.0 standard drinks of alcohol   Drug use: No   Sexual activity: Yes    Birth control/protection: Surgical  Other Topics Concern   Not on file  Social History Narrative   Not on file   Social Drivers of Health   Financial Resource Strain: Not on file  Food Insecurity: Not on file  Transportation Needs: Not on file  Physical Activity: Not on file  Stress: Not on file  Social Connections: Not on file    Review of Systems Per HPI  Objective:  BP 128/82   Pulse 100   Temp 98.1 F (36.7 C)   Ht 5' 5 (1.651 m)   Wt 230 lb (104.3 kg)   SpO2 100%   BMI 38.27 kg/m      06/04/2024    3:51 PM 04/12/2024    2:03 PM 01/09/2024    2:26 PM  BP/Weight  Systolic BP 128 120 111  Diastolic BP 82 84 80  Wt. (Lbs) 230 235.2 239  BMI 38.27 kg/m2 39.14 kg/m2 39.77 kg/m2     Physical Exam Constitutional:      General: She is not in acute distress.    Appearance: Normal appearance.  HENT:     Head: Normocephalic and atraumatic.     Ears:     Comments: Right TM erythematous.    Nose: Congestion present.     Mouth/Throat:     Pharynx: Posterior oropharyngeal erythema present.  Cardiovascular:     Rate and Rhythm: Normal rate and regular rhythm.  Pulmonary:     Effort: Pulmonary effort is normal.     Breath sounds: Normal breath sounds.  Musculoskeletal:     Cervical back: Neck supple.  Lymphadenopathy:     Cervical: No cervical adenopathy.  Neurological:     Mental Status: She is alert.     Lab Results  Component Value Date   WBC 8.6 11/25/2023   HGB 14.1 11/25/2023   HCT 42.5 11/25/2023   PLT 361 11/25/2023   GLUCOSE 103 (H) 11/25/2023   CHOL 160 11/25/2023   TRIG 149 11/25/2023   HDL 34 (L) 11/25/2023   LDLCALC 100 (H) 11/25/2023   ALT 26 11/25/2023   AST 19 11/25/2023   NA  140 11/25/2023   K 4.9 11/25/2023   CL 105 11/25/2023   CREATININE 0.86 11/25/2023   BUN 11 11/25/2023   CO2 21 11/25/2023   TSH 3.550 12/01/2020   HGBA1C 5.4 12/13/2022     Assessment & Plan:  Right acute serous otitis media, recurrence not specified Assessment & Plan: Treating with Augmentin .  Orders: -     Amoxicillin -Pot Clavulanate; Take 1 tablet by mouth 2 (two) times daily.  Dispense: 14 tablet; Refill: 0  Other orders -     Promethazine -DM; Take 5 mLs by mouth 4 (four) times daily as needed for cough.  Dispense: 118 mL; Refill: 0    Follow-up:  Return if symptoms worsen or fail to improve.  Jacqulyn Ahle DO [facility]

## 2024-06-04 NOTE — Assessment & Plan Note (Signed)
 Treating with Augmentin.

## 2024-06-04 NOTE — Patient Instructions (Signed)
 Medication as prescribed.  Take care  Dr. Adriana Simas

## 2024-06-15 ENCOUNTER — Encounter: Payer: Self-pay | Admitting: Family Medicine

## 2024-06-17 ENCOUNTER — Other Ambulatory Visit: Payer: Self-pay | Admitting: Nurse Practitioner

## 2024-06-17 ENCOUNTER — Encounter: Payer: Self-pay | Admitting: Family Medicine

## 2024-06-19 ENCOUNTER — Ambulatory Visit: Admitting: Family Medicine

## 2024-06-21 ENCOUNTER — Ambulatory Visit: Admitting: Physician Assistant

## 2024-06-21 ENCOUNTER — Encounter: Payer: Self-pay | Admitting: Physician Assistant

## 2024-06-21 VITALS — BP 124/78

## 2024-06-21 DIAGNOSIS — I10 Essential (primary) hypertension: Secondary | ICD-10-CM | POA: Diagnosis not present

## 2024-06-21 DIAGNOSIS — Z23 Encounter for immunization: Secondary | ICD-10-CM

## 2024-06-21 MED ORDER — AMLODIPINE BESYLATE 5 MG PO TABS: 5.0000 mg | ORAL_TABLET | Freq: Every day | ORAL | 1 refills | Status: DC

## 2024-06-21 NOTE — Addendum Note (Signed)
 Addended by: GRETTA MASTERS R on: 06/21/2024 11:15 AM   Modules accepted: Orders

## 2024-06-21 NOTE — Assessment & Plan Note (Addendum)
 Cough due to ACEi, chronic cough likely exacerbated by recent upper respiratory infection. Cough is a common side effect of lisinopril .Blood pressure is well-controlled despite not taking lisinopril  since Sunday. Plan to switch antihypertensive medication to manage side effects while maintaining blood pressure control. - Discontinue lisinopril . Start amlodipine  5 mg daily. - Monitor blood pressure and follow up in one month to assess control with new medication. - Advise to report any adverse effects such as dizziness or syncope.

## 2024-06-21 NOTE — Progress Notes (Signed)
   Established Patient Office Visit  Subjective   Patient ID: Raven Burch, female    DOB: October 21, 1980  Age: 43 y.o. MRN: [id number]  Chief Complaint  Patient presents with   Hypertension    Change BP medication due dry cough and hacking    Discussed the use of AI scribe software for clinical note transcription with the patient, who gave verbal consent to proceed.  History of Present Illness Raven Burch is a 43 year old female with hypertension who presents with a persistent cough.  She has had a persistent cough since starting lisinopril  two to three years ago, which has worsened after an upper respiratory and ear infection three weeks ago. The cough is severe enough to disrupt her sleep. She stopped taking lisinopril  on Sunday. There is no chest pain, shortness of breath, or visual changes.    Review of Systems  Constitutional:  Negative for chills, fever and malaise/fatigue.  Eyes:  Negative for blurred vision and double vision.  Respiratory:  Positive for cough. Negative for shortness of breath.   Cardiovascular:  Negative for chest pain and palpitations.  Neurological:  Negative for dizziness and headaches.      Objective:     BP 124/78   SpO2 98%    Physical Exam Constitutional:      Appearance: Normal appearance. She is obese.  HENT:     Head: Normocephalic and atraumatic.     Mouth/Throat:     Mouth: Mucous membranes are moist.     Pharynx: Oropharynx is clear.  Eyes:     Extraocular Movements: Extraocular movements intact.     Conjunctiva/sclera: Conjunctivae normal.  Cardiovascular:     Rate and Rhythm: Normal rate and regular rhythm.     Heart sounds: Normal heart sounds. No murmur heard. Pulmonary:     Effort: Pulmonary effort is normal.     Breath sounds: Normal breath sounds. No wheezing or rales.  Musculoskeletal:     Right lower leg: No edema.     Left lower leg: No edema.  Skin:    General: Skin is warm and dry.  Neurological:      General: No focal deficit present.     Mental Status: She is alert and oriented to person, place, and time.  Psychiatric:        Mood and Affect: Mood normal.        Behavior: Behavior normal.     No results found for any visits on 06/21/24.  The 10-year ASCVD risk score (Arnett DK, et al., 2019) is: 1.4%    Assessment & Plan:   Return in about 4 weeks (around 07/19/2024) for BP.   Essential hypertension Assessment & Plan: Cough due to ACEi, chronic cough likely exacerbated by recent upper respiratory infection. Cough is a common side effect of lisinopril .Blood pressure is well-controlled despite not taking lisinopril  since Sunday. Plan to switch antihypertensive medication to manage side effects while maintaining blood pressure control. - Discontinue lisinopril . Start amlodipine  5 mg daily. - Monitor blood pressure and follow up in one month to assess control with new medication. - Advise to report any adverse effects such as dizziness or syncope.  Orders: -     amLODIPine  Besylate; Take 1 tablet (5 mg total) by mouth daily.  Dispense: 30 tablet; Refill: 1   Stephfon Bovey, PA-C

## 2024-07-13 ENCOUNTER — Other Ambulatory Visit: Payer: Self-pay | Admitting: Physician Assistant

## 2024-07-13 DIAGNOSIS — I10 Essential (primary) hypertension: Secondary | ICD-10-CM

## 2024-07-17 ENCOUNTER — Ambulatory Visit: Admitting: Physician Assistant

## 2024-07-17 ENCOUNTER — Encounter: Payer: Self-pay | Admitting: Physician Assistant

## 2024-07-17 VITALS — BP 129/88 | HR 86 | Temp 97.7°F | Ht 65.0 in | Wt 240.9 lb

## 2024-07-17 DIAGNOSIS — I10 Essential (primary) hypertension: Secondary | ICD-10-CM

## 2024-07-17 NOTE — Progress Notes (Signed)
   Established Patient Office Visit  Subjective   Patient ID: Raven Burch, female    DOB: 07-27-1981  Age: 43 y.o. MRN: [id number]  Chief Complaint  Patient presents with   Follow-up    Pt. Here for a follow up on her bp medication    Patient presents today for follow up regarding hypertension. Currently on amlodipine  5 mg, and states daily compliance with medications. Patient denies headaches, blurry vision, dizziness, chest pain, shortness of breath, or palpitations. Working on weight loss and watching salt intake. Due for physical coming in March.    Review of Systems  Constitutional:  Negative for chills, fever and malaise/fatigue.  Eyes:  Negative for blurred vision and double vision.  Respiratory:  Negative for shortness of breath.   Cardiovascular:  Negative for chest pain and palpitations.  Neurological:  Negative for dizziness and headaches.      Objective:     BP 129/88 (BP Location: Left Arm, Patient Position: Sitting)   Pulse 86   Temp 97.7 F (36.5 C)   Ht 5' 5 (1.651 m)   Wt 240 lb 14 oz (109.3 kg)   SpO2 99%   BMI 40.08 kg/m    Physical Exam Constitutional:      General: She is not in acute distress.    Appearance: Normal appearance. She is obese. She is not ill-appearing.  HENT:     Head: Normocephalic and atraumatic.     Mouth/Throat:     Mouth: Mucous membranes are moist.     Pharynx: Oropharynx is clear.  Eyes:     Extraocular Movements: Extraocular movements intact.     Conjunctiva/sclera: Conjunctivae normal.  Cardiovascular:     Rate and Rhythm: Normal rate and regular rhythm.     Heart sounds: Normal heart sounds. No murmur heard. Pulmonary:     Effort: Pulmonary effort is normal.     Breath sounds: Normal breath sounds.  Musculoskeletal:     Right lower leg: No edema.     Left lower leg: No edema.  Skin:    General: Skin is warm and dry.  Neurological:     General: No focal deficit present.     Mental Status: She is alert and  oriented to person, place, and time.  Psychiatric:        Mood and Affect: Mood normal.        Behavior: Behavior normal.     No results found for any visits on 07/17/24.  The 10-year ASCVD risk score (Arnett DK, et al., 2019) is: 1.5%    Assessment & Plan:   Return in about 5 months (around 12/15/2024) for Phys .   Essential hypertension Assessment & Plan: 129/88 Controlled. Continue current medications. No change in management. Discussed DASH diet and dietary sodium restrictions.  Increase dietary efforts and physical activity. Continue to monitor BP at home. Follow up for new chest pain, shortness of breath, headache, or visual changes.       Charmaine Jerrel Tiberio, PA-C

## 2024-07-17 NOTE — Assessment & Plan Note (Signed)
 129/88 Controlled. Continue current medications. No change in management. Discussed DASH diet and dietary sodium restrictions.  Increase dietary efforts and physical activity. Continue to monitor BP at home. Follow up for new chest pain, shortness of breath, headache, or visual changes.

## 2024-07-20 ENCOUNTER — Ambulatory Visit: Admitting: Physician Assistant

## 2024-09-26 ENCOUNTER — Ambulatory Visit: Payer: Self-pay

## 2024-09-26 NOTE — Telephone Encounter (Signed)
 FYI Only or Action Required?: FYI only for provider: UC advised.  Patient was last seen in primary care on 07/17/2024 by [facility], [place], [place].  Called Nurse Triage reporting Cough.  Symptoms began several days ago.  Interventions attempted: OTC medications: Dayquil, Nyquil.  Symptoms are: stable.  Triage Disposition: See Physician Within 24 Hours  Patient/caregiver understands and will follow disposition?: Yes  Reason for Disposition  [place]  Answer Assessment - Initial Assessment Questions Patient has used Dayquil and Nyquil. No available appointments in PCP office within dispo, advised UC  1. ONSET: When did the cough begin?      Sunday  2. SEVERITY: How bad is the cough today?      About the same since it started  3. SPUTUM: Describe the color of your sputum (e.g., none, dry cough; clear, white, yellow, green)     Clear  4. HEMOPTYSIS: Are you coughing up any blood? If Yes, ask: How much? (e.g., flecks, streaks, tablespoons, etc.)     Denies  5. DIFFICULTY BREATHING: Are you having difficulty breathing? If Yes, ask: How bad is it? (e.g., mild, moderate, severe)      Denies  6. FEVER: Do you have a fever? If Yes, ask: What is your temperature, how was it measured, and when did it start?     Denies  7. CARDIAC HISTORY: Do you have any history of heart disease? (e.g., heart attack, congestive heart failure)      Denies  8. LUNG HISTORY: Do you have any history of lung disease?  (e.g., pulmonary embolus, asthma, emphysema)     Denies  9. PE RISK FACTORS: Do you have a history of blood clots? (or: recent major surgery, recent prolonged travel, bedridden)     Denies  10. OTHER SYMPTOMS: Do you have any other symptoms? (e.g., runny nose, wheezing, chest pain)       Headache comes and goes, runny pain, bilateral ear pain occasionally feels full, hard to hear, denies drainage from ears, horse voice  Protocols used: Cough - Acute  Productive-A-AH  Copied from CRM #[id number]. Topic: Clinical - Red Word Triage >> Sep 26, 2024  8:35 AM Roselie BROCKS wrote: [facility] that prompted transfer to Nurse Triage: Patient states she has a severe headache,deep cough,congestion.

## 2024-12-10 ENCOUNTER — Encounter: Admitting: Physician Assistant

## 2024-12-11 ENCOUNTER — Encounter: Admitting: Nurse Practitioner

## 2024-12-28 ENCOUNTER — Encounter (HOSPITAL_COMMUNITY)

## 2024-12-28 DIAGNOSIS — Z1231 Encounter for screening mammogram for malignant neoplasm of breast: Secondary | ICD-10-CM

## 2024-12-31 ENCOUNTER — Encounter (HOSPITAL_COMMUNITY)

## 2024-12-31 DIAGNOSIS — Z1231 Encounter for screening mammogram for malignant neoplasm of breast: Secondary | ICD-10-CM
